# Patient Record
Sex: Male | Born: 1955 | Race: White | Hispanic: No | Marital: Married | Smoking: Former smoker
Health system: Southern US, Community
[De-identification: ages and names within clinical notes are randomized; demographics above are authoritative.]

## PROBLEM LIST (undated history)

## (undated) DIAGNOSIS — E785 Hyperlipidemia, unspecified: Secondary | ICD-10-CM

## (undated) DIAGNOSIS — K3533 Acute appendicitis with perforation and localized peritonitis, with abscess: Secondary | ICD-10-CM

## (undated) DIAGNOSIS — T4145XA Adverse effect of unspecified anesthetic, initial encounter: Secondary | ICD-10-CM

## (undated) DIAGNOSIS — T8859XA Other complications of anesthesia, initial encounter: Secondary | ICD-10-CM

## (undated) HISTORY — DX: Acute appendicitis with perforation, localized peritonitis, and gangrene, with abscess: K35.33

## (undated) HISTORY — DX: Hyperlipidemia, unspecified: E78.5

## (undated) HISTORY — PX: FINGER SURGERY: SHX640

## (undated) HISTORY — PX: HERNIA REPAIR: SHX51

---

## 2013-07-26 ENCOUNTER — Inpatient Hospital Stay (HOSPITAL_COMMUNITY)
Admission: EM | Admit: 2013-07-26 | Discharge: 2013-07-30 | DRG: 339 | Disposition: A | Discharge status: Home / Self Care | Payer: PRIVATE HEALTH INSURANCE | Attending: Surgery | Admitting: Surgery

## 2013-07-26 ENCOUNTER — Encounter (HOSPITAL_COMMUNITY): Payer: Self-pay | Admitting: Emergency Medicine

## 2013-07-26 ENCOUNTER — Encounter (HOSPITAL_COMMUNITY): Admission: EM | Disposition: A | Discharge status: Home / Self Care | Payer: Self-pay | Source: Home / Self Care

## 2013-07-26 ENCOUNTER — Emergency Department (HOSPITAL_COMMUNITY): Payer: PRIVATE HEALTH INSURANCE

## 2013-07-26 DIAGNOSIS — K3532 Acute appendicitis with perforation and localized peritonitis, without abscess: Secondary | ICD-10-CM

## 2013-07-26 DIAGNOSIS — A498 Other bacterial infections of unspecified site: Secondary | ICD-10-CM | POA: Diagnosis present

## 2013-07-26 DIAGNOSIS — K3533 Acute appendicitis with perforation and localized peritonitis, with abscess: Principal | ICD-10-CM | POA: Diagnosis present

## 2013-07-26 DIAGNOSIS — K651 Peritoneal abscess: Secondary | ICD-10-CM | POA: Diagnosis present

## 2013-07-26 DIAGNOSIS — K632 Fistula of intestine: Secondary | ICD-10-CM

## 2013-07-26 DIAGNOSIS — K352 Acute appendicitis with generalized peritonitis, without abscess: Secondary | ICD-10-CM

## 2013-07-26 DIAGNOSIS — K35209 Acute appendicitis with generalized peritonitis, without abscess, unspecified as to perforation: Secondary | ICD-10-CM

## 2013-07-26 DIAGNOSIS — D414 Neoplasm of uncertain behavior of bladder: Secondary | ICD-10-CM | POA: Diagnosis present

## 2013-07-26 HISTORY — DX: Other complications of anesthesia, initial encounter: T88.59XA

## 2013-07-26 HISTORY — DX: Adverse effect of unspecified anesthetic, initial encounter: T41.45XA

## 2013-07-26 LAB — URINALYSIS, ROUTINE W REFLEX MICROSCOPIC
Glucose, UA: NEGATIVE mg/dL
Hgb urine dipstick: NEGATIVE
Ketones, ur: 15 mg/dL — AB
Leukocytes, UA: NEGATIVE
Nitrite: NEGATIVE
Protein, ur: 100 mg/dL — AB
Specific Gravity, Urine: 1.034 — ABNORMAL HIGH (ref 1.005–1.030)
Urobilinogen, UA: 1 mg/dL (ref 0.0–1.0)
pH: 6 (ref 5.0–8.0)

## 2013-07-26 LAB — COMPREHENSIVE METABOLIC PANEL
ALT: 21 U/L (ref 0–53)
AST: 16 U/L (ref 0–37)
Albumin: 3.2 g/dL — ABNORMAL LOW (ref 3.5–5.2)
Alkaline Phosphatase: 125 U/L — ABNORMAL HIGH (ref 39–117)
BUN: 16 mg/dL (ref 6–23)
CO2: 26 mEq/L (ref 19–32)
Calcium: 9.4 mg/dL (ref 8.4–10.5)
Chloride: 100 mEq/L (ref 96–112)
Creatinine, Ser: 1.01 mg/dL (ref 0.50–1.35)
GFR calc Af Amer: >90 mL/min (ref >90)
GFR calc non Af Amer: 81 mL/min — ABNORMAL LOW (ref >90)
Glucose, Bld: 108 mg/dL — ABNORMAL HIGH (ref 70–99)
Potassium: 4.6 mEq/L (ref 3.5–5.1)
Sodium: 137 mEq/L (ref 135–145)
Total Bilirubin: 0.5 mg/dL (ref 0.3–1.2)
Total Protein: 7.6 g/dL (ref 6.0–8.3)

## 2013-07-26 LAB — CBC WITH DIFFERENTIAL/PLATELET
Basophils Absolute: 0 10*3/uL (ref 0.0–0.1)
Basophils Relative: 0 % (ref 0–1)
Eosinophils Absolute: 0 10*3/uL (ref 0.0–0.7)
Eosinophils Relative: 0 % (ref 0–5)
HCT: 38.9 % — ABNORMAL LOW (ref 39.0–52.0)
Hemoglobin: 13.1 g/dL (ref 13.0–17.0)
Lymphocytes Relative: 9 % — ABNORMAL LOW (ref 12–46)
Lymphs Abs: 1.1 10*3/uL (ref 0.7–4.0)
MCH: 29.6 pg (ref 26.0–34.0)
MCHC: 33.7 g/dL (ref 30.0–36.0)
MCV: 87.8 fL (ref 78.0–100.0)
Monocytes Absolute: 1.2 10*3/uL — ABNORMAL HIGH (ref 0.1–1.0)
Monocytes Relative: 10 % (ref 3–12)
Neutro Abs: 10 10*3/uL — ABNORMAL HIGH (ref 1.7–7.7)
Neutrophils Relative %: 81 % — ABNORMAL HIGH (ref 43–77)
Platelets: 302 10*3/uL (ref 150–400)
RBC: 4.43 MIL/uL (ref 4.22–5.81)
RDW: 12.7 % (ref 11.5–15.5)
WBC Morphology: INCREASED
WBC: 12.3 10*3/uL — ABNORMAL HIGH (ref 4.0–10.5)

## 2013-07-26 LAB — URINE MICROSCOPIC-ADD ON

## 2013-07-26 LAB — LIPASE, BLOOD: Lipase: 24 U/L (ref 11–59)

## 2013-07-26 SURGERY — APPENDECTOMY, LAPAROSCOPIC
Anesthesia: General

## 2013-07-26 MED ORDER — MORPHINE SULFATE 4 MG/ML IJ SOLN
4.0000 mg | Freq: Once | INTRAMUSCULAR | Status: AC
  Administered 2013-07-26: 4 mg via INTRAVENOUS
  Filled 2013-07-26: qty 1

## 2013-07-26 MED ORDER — ACETAMINOPHEN 650 MG RE SUPP
650.0000 mg | Freq: Once | RECTAL | Status: AC
  Administered 2013-07-26: 650 mg via RECTAL
  Filled 2013-07-26: qty 1

## 2013-07-26 MED ORDER — MORPHINE SULFATE 2 MG/ML IJ SOLN
1.0000 mg | INTRAMUSCULAR | Status: DC | PRN
  Administered 2013-07-26 – 2013-07-27 (×3): 4 mg via INTRAVENOUS
  Filled 2013-07-26 (×3): qty 2

## 2013-07-26 MED ORDER — IOHEXOL 300 MG/ML  SOLN
50.0000 mL | Freq: Once | INTRAMUSCULAR | Status: AC | PRN
  Administered 2013-07-26: 50 mL via ORAL

## 2013-07-26 MED ORDER — KCL IN DEXTROSE-NACL 20-5-0.45 MEQ/L-%-% IV SOLN
INTRAVENOUS | Status: DC
  Administered 2013-07-26 – 2013-07-27 (×2): 150 mL/h via INTRAVENOUS
  Administered 2013-07-27: 14:00:00 via INTRAVENOUS
  Administered 2013-07-27 – 2013-07-28 (×2): 150 mL/h via INTRAVENOUS
  Administered 2013-07-29 (×2): via INTRAVENOUS
  Filled 2013-07-26 (×12): qty 1000

## 2013-07-26 MED ORDER — ONDANSETRON HCL 4 MG/2ML IJ SOLN
4.0000 mg | Freq: Four times a day (QID) | INTRAMUSCULAR | Status: DC | PRN
  Administered 2013-07-27: 4 mg via INTRAVENOUS
  Filled 2013-07-26: qty 2

## 2013-07-26 MED ORDER — PIPERACILLIN-TAZOBACTAM 3.375 G IVPB
3.3750 g | Freq: Three times a day (TID) | INTRAVENOUS | Status: DC
  Administered 2013-07-26 – 2013-07-30 (×11): 3.375 g via INTRAVENOUS
  Filled 2013-07-26 (×12): qty 50

## 2013-07-26 MED ORDER — SODIUM CHLORIDE 0.9 % IV SOLN
3.0000 g | Freq: Once | INTRAVENOUS | Status: AC
  Administered 2013-07-26: 3 g via INTRAVENOUS
  Filled 2013-07-26: qty 3

## 2013-07-26 MED ORDER — HYDROCODONE-ACETAMINOPHEN 5-325 MG PO TABS
1.0000 | ORAL_TABLET | ORAL | Status: DC | PRN
  Administered 2013-07-27: 1 via ORAL
  Administered 2013-07-27 – 2013-07-30 (×9): 2 via ORAL
  Filled 2013-07-26 (×11): qty 2

## 2013-07-26 MED ORDER — HEPARIN SODIUM (PORCINE) 5000 UNIT/ML IJ SOLN
5000.0000 [IU] | Freq: Three times a day (TID) | INTRAMUSCULAR | Status: DC
  Administered 2013-07-27 – 2013-07-30 (×9): 5000 [IU] via SUBCUTANEOUS
  Filled 2013-07-26 (×12): qty 1

## 2013-07-26 MED ORDER — IOHEXOL 300 MG/ML  SOLN
100.0000 mL | Freq: Once | INTRAMUSCULAR | Status: AC | PRN
  Administered 2013-07-26: 100 mL via INTRAVENOUS

## 2013-07-26 NOTE — ED Notes (Signed)
Password for pt is max

## 2013-07-26 NOTE — Progress Notes (Signed)
Patient confirms he does not have a pcp.  Patient confirms he has insurance. EDCM instructed patient to call the phone number on the back of his insurnace card to help him find a physician who is close to him and within network.  Patient verbalized understanding.  [facility] offered patient support.

## 2013-07-26 NOTE — ED Notes (Signed)
Pt states was sent here by urgent care doctor who said she thinks it is appendicitis from exam.

## 2013-07-26 NOTE — ED Notes (Signed)
Pt states 8 days ago started having fever on and off along w/ abdominal pain/soreness, denies n/v/d, states he thinks he has appendicitis.

## 2013-07-26 NOTE — H&P (Signed)
Re:   Shulem Mader DOB:   1955-10-22 MRN:   [id number]  [facility] Admission Note  ASSESSMENT AND PLAN: 1.  Appendiceal abscess - RLQ  This measures 6 x 3 cm on CT (though it may not be that large).  The patient is 7+ days into his symptoms.  I discussed perc drainage with Dr. Deanne Coffer who thought it possible.  I think that he would be best served with IV antibiotics tonight and perc drainage tomorrow AM.  I discussed with the patient and his wife the possiblity that perc drain may not work and he still may need more urgent surgery.  I also discussed that urgent surgery may be difficult and lead to an increased risk of bowel damage.  I also discussed the need for colonoscopy because of age and presentation.  They seemed to understand the plan.  2.  Polyp in bladder on CT scan.  Will need urology follow up.  I gave the wife a copy of the CT scan.  Chief Complaint  Patient presents with  . Abdominal Pain  . Fever   REFERRING PHYSICIAN:  Dr. Alric Ran, [facility]  HISTORY OF PRESENT ILLNESS: Trashawn Oquendo is a 57 y.o. (DOB: March 10, 1956)  white  male whose primary care physician is No primary provider on file. and comes to [facility] with worsening RLQ pain.  This episode of pain started last Thursday, 07/19/2013, when he developed epigastric abdominal pain.  He has had no prior GI problems and thought that he had food poisoning.  The pain got worse and localized to the RLQ of his abdomen over the weekend.  But then it started getting better from Sunday until yesterday.  Because it got better, he did not seek medical help. Then then pain in the RLQ got worse and today was severe enough that he come to the [facility].  His only prior abdominal surgery is that he has had 2 left inguinal hernia repairs and one right inguinal hernia repairs.  WBC - 07/26/2013 - 12,300    Past Medical History  Diagnosis Date  . Complication of anesthesia     Past Surgical History  Procedure Laterality Date  . Finger surgery       tip repair on finger of left hand  . Hernia repair      x 3    No current facility-administered medications for this encounter.   Current Outpatient Prescriptions  Medication Sig Dispense Refill  . ibuprofen (ADVIL,MOTRIN) 200 MG tablet Take 800 mg by mouth every 6 (six) hours as needed for pain.         No Known Allergies  REVIEW OF SYSTEMS: Skin:  No history of rash.  No history of abnormal moles. Infection:   No history of MRSA. Neurologic:  No history of stroke.  No history of seizure.  No history of headaches. Cardiac:  No history of hypertension. No history of heart disease.  No history of prior cardiac catheterization.  No history of seeing a cardiologist. Pulmonary:  Does not smoke cigarettes.  No asthma or bronchitis.  No OSA/CPAP.  Endocrine:  No diabetes. No thyroid disease. Gastrointestinal:  No history of stomach disease.  No history of liver disease.  No history of gall bladder disease.  No history of pancreas disease.  No history of colon disease. No history of colonoscopy. He has had inguinal hernia repairs - 2 on the left and one on the right. Urologic:  He has some trouble voiding, but is not seeing  a urologist. Musculoskeletal:  He injured the tip of his left ring finger when he was young. Hematologic:  No bleeding disorder.  No history of anemia.  Not anticoagulated. Psycho-social:  The patient is oriented.   He is impatient with all that has gone on.  SOCIAL and FAMILY HISTORY: Wife at bedside. They moved here from [place] about 2 years ago. He is working maintenance for [employer]. His son and daughter in law were in the exam room.  PHYSICAL EXAM: BP 132/71  Pulse 85  Temp(Src) 98.3 F (36.8 C) (Oral)  Resp 18  Ht 6' (1.829 m)  Wt 228 lb (103.42 kg)  BMI 30.92 kg/m2  SpO2 100%  General: WN WM who is alert and generally healthy appearing.  HEENT: Normal. Pupils equal. Neck: Supple. No mass.  No thyroid mass. Lymph Nodes:  No supraclavicular or  cervical nodes. Lungs: Clear to auscultation and symmetric breath sounds. Heart:  RRR. No murmur or rub. Abdomen: Bilateral groin scars.  Tender RLQ with some mass effect. Rectal: Not done. Extremities:  Good strength and ROM  in upper and lower extremities. Neurologic:  Grossly intact to motor and sensory function. Psychiatric: Has normal mood and affect. He is impatient about all that has gone on.  DATA REVIEWED: Epic notes and CT scan.  Ovidio Kin, MD,  [facility] [address] [place]  [address]   [place], [place]    [address] Phone:  [phone] FAX:  [phone]

## 2013-07-26 NOTE — ED Provider Notes (Signed)
CSN: [id number]     Arrival date & time 07/26/13  1352 History   First MD Initiated Contact with Patient 07/26/13 1522     Chief Complaint  Patient presents with  . Abdominal Pain  . Fever   (Consider location/radiation/quality/duration/timing/severity/associated sxs/prior Treatment) Patient is a 57 y.o. male presenting with abdominal pain and fever. The history is provided by the patient.  Abdominal Pain Pain location:  RLQ Pain quality: aching and sharp   Pain radiates to:  Does not radiate Pain severity:  Moderate Onset quality:  Gradual Duration:  7 days Timing:  Intermittent Progression:  Worsening Chronicity:  New Relieved by:  OTC medications and NSAIDs Worsened by:  Coughing and movement Associated symptoms: chills, diarrhea and fever   Associated symptoms: no chest pain, no dysuria, no fatigue, no hematuria, no nausea, no shortness of breath and no vomiting   Associated symptoms comment:  Pt is a 57 yo male in today for progressive 7 day history of abdominal pain. Pain was initially generalized and has localized to the RLQ yesterday. The pain has progressed to a 7/10 over this time period. He has had low grade fevers with a high of 100.77F.  He has been taking 800mg  of ASA q 6-8 hours since onset of symptoms. Patient states that his symptoms are not affected by eating. Patient denies SOB, chest pain, nausea, vomiting, recent illness or travel.   Fever Associated symptoms: chills and diarrhea   Associated symptoms: no chest pain, no congestion, no dysuria, no headaches, no nausea and no vomiting     History reviewed. No pertinent past medical history. Past Surgical History  Procedure Laterality Date  . Hernia repair     No family history on file. History  Substance Use Topics  . Smoking status: Never Smoker   . Smokeless tobacco: Never Used  . Alcohol Use: No    Review of Systems  Constitutional: Positive for fever, chills, diaphoresis, activity change and appetite  change. Negative for fatigue.  HENT: Negative for congestion.   Eyes: Negative for photophobia, pain, discharge, redness, itching and visual disturbance.  Respiratory: Negative for chest tightness and shortness of breath.   Cardiovascular: Negative for chest pain.  Gastrointestinal: Positive for abdominal pain and diarrhea. Negative for nausea, vomiting and blood in stool.  Endocrine: Negative for cold intolerance, heat intolerance, polydipsia, polyphagia and polyuria.  Genitourinary: Negative for dysuria, frequency, hematuria and flank pain.  Allergic/Immunologic: Negative for environmental allergies, food allergies and immunocompromised state.  Neurological: Negative for dizziness, facial asymmetry, weakness, light-headedness and headaches.  Hematological: Negative for adenopathy.    Allergies  Review of patient's allergies indicates no known allergies.  Home Medications   Current Outpatient Rx  Name  Route  Sig  Dispense  Refill  . ibuprofen (ADVIL,MOTRIN) 200 MG tablet   Oral   Take 800 mg by mouth every 6 (six) hours as needed for pain.          BP 132/71  Pulse 85  Temp(Src) 98.3 F (36.8 C) (Oral)  Resp 18  Ht 6' (1.829 m)  Wt 228 lb (103.42 kg)  BMI 30.92 kg/m2  SpO2 100% Physical Exam  Constitutional: He appears well-developed and well-nourished. He is active.  HENT:  Head: Normocephalic.  Eyes: Pupils are equal, round, and reactive to light. Right eye exhibits no discharge. Left eye exhibits no discharge.  Neck: Trachea normal and normal range of motion. Neck supple. No mass and no thyromegaly present.  Cardiovascular: Normal rate, regular rhythm,  S1 normal, S2 normal and normal heart sounds.  Exam reveals no gallop and no friction rub.   No murmur heard. Pulmonary/Chest: Effort normal and breath sounds normal. No respiratory distress. He has no wheezes. He has no rales. He exhibits no tenderness.  Abdominal: Soft. Bowel sounds are normal. He exhibits no  distension and no mass. There is tenderness. There is tenderness at McBurney's point. There is no rigidity, no rebound and no guarding.    Patient has tenderness to palpation of RLQ  Lymphadenopathy:    He has no cervical adenopathy.  Skin: Skin is warm, dry and intact.    ED Course  Procedures (including critical care time) Labs Review Labs Reviewed  CBC WITH DIFFERENTIAL - Abnormal; Notable for the following:    WBC 12.3 (*)    HCT 38.9 (*)    Neutrophils Relative % 81 (*)    Lymphocytes Relative 9 (*)    Neutro Abs 10.0 (*)    Monocytes Absolute 1.2 (*)    All other components within normal limits  COMPREHENSIVE METABOLIC PANEL - Abnormal; Notable for the following:    Glucose, Bld 108 (*)    Albumin 3.2 (*)    Alkaline Phosphatase 125 (*)    GFR calc non Af Amer 81 (*)    All other components within normal limits  LIPASE, BLOOD  URINALYSIS, ROUTINE W REFLEX MICROSCOPIC   Results for orders placed during the hospital encounter of 07/26/13  CBC WITH DIFFERENTIAL      Result Value Range   WBC 12.3 (*) 4.0 - 10.5 K/uL   RBC 4.43  4.22 - 5.81 MIL/uL   Hemoglobin 13.1  13.0 - 17.0 g/dL   HCT 16.1 (*) 09.6 - 04.5 %   MCV 87.8  78.0 - 100.0 fL   MCH 29.6  26.0 - 34.0 pg   MCHC 33.7  30.0 - 36.0 g/dL   RDW 40.9  81.1 - 91.4 %   Platelets 302  150 - 400 K/uL   Neutrophils Relative % 81 (*) 43 - 77 %   Lymphocytes Relative 9 (*) 12 - 46 %   Monocytes Relative 10  3 - 12 %   Eosinophils Relative 0  0 - 5 %   Basophils Relative 0  0 - 1 %   Neutro Abs 10.0 (*) 1.7 - 7.7 K/uL   Lymphs Abs 1.1  0.7 - 4.0 K/uL   Monocytes Absolute 1.2 (*) 0.1 - 1.0 K/uL   Eosinophils Absolute 0.0  0.0 - 0.7 K/uL   Basophils Absolute 0.0  0.0 - 0.1 K/uL   WBC Morphology INCREASED BANDS (>20% BANDS)     Smear Review PLATELET COUNT CONFIRMED BY SMEAR    COMPREHENSIVE METABOLIC PANEL      Result Value Range   Sodium 137  135 - 145 mEq/L   Potassium 4.6  3.5 - 5.1 mEq/L   Chloride 100  96 -  112 mEq/L   CO2 26  19 - 32 mEq/L   Glucose, Bld 108 (*) 70 - 99 mg/dL   BUN 16  6 - 23 mg/dL   Creatinine, Ser 7.82  0.50 - 1.35 mg/dL   Calcium 9.4  8.4 - 95.6 mg/dL   Total Protein 7.6  6.0 - 8.3 g/dL   Albumin 3.2 (*) 3.5 - 5.2 g/dL   AST 16  0 - 37 U/L   ALT 21  0 - 53 U/L   Alkaline Phosphatase 125 (*) 39 - 117 U/L  Total Bilirubin 0.5  0.3 - 1.2 mg/dL   GFR calc non Af Amer 81 (*) >90 mL/min   GFR calc Af Amer >90  >90 mL/min  LIPASE, BLOOD      Result Value Range   Lipase 24  11 - 59 U/L  URINALYSIS, ROUTINE W REFLEX MICROSCOPIC      Result Value Range   Color, Urine AMBER (*) YELLOW   APPearance CLEAR  CLEAR   Specific Gravity, Urine 1.034 (*) 1.005 - 1.030   pH 6.0  5.0 - 8.0   Glucose, UA NEGATIVE  NEGATIVE mg/dL   Hgb urine dipstick NEGATIVE  NEGATIVE   Bilirubin Urine SMALL (*) NEGATIVE   Ketones, ur 15 (*) NEGATIVE mg/dL   Protein, ur 295 (*) NEGATIVE mg/dL   Urobilinogen, UA 1.0  0.0 - 1.0 mg/dL   Nitrite NEGATIVE  NEGATIVE   Leukocytes, UA NEGATIVE  NEGATIVE  URINE MICROSCOPIC-ADD ON      Result Value Range   Bacteria, UA FEW (*) RARE   Ct Abdomen Pelvis W Contrast  07/26/2013   CLINICAL DATA:  Right lower quadrant pain for 8 days  EXAM: CT ABDOMEN AND PELVIS WITH CONTRAST  TECHNIQUE: Multidetector CT imaging of the abdomen and pelvis was performed using the standard protocol following bolus administration of intravenous contrast.  CONTRAST:  OMNIPAQUE IOHEXOL 300 MG/ML  SOLN  COMPARISON:  None.  FINDINGS: Lung bases are unremarkable.  Sagittal images of the spine shows mild degenerative changes lumbar spine.  Enhanced liver is unremarkable. The pancreas, spleen and adrenal glands are unremarkable. Enhanced kidneys are symmetrical in size. No hydronephrosis or hydroureter. Mild right perinephric fluid and stranding as seen in axial image 47.  Delayed renal images shows bilateral renal symmetrical excretion.  There is inflammatory process with irregular  collection with peripheral enhancement in right lower quadrant just medial to the cecum anterior to right psoas muscle. The collection measures at least 6.1 x 3.6 cm. A focal appendicolith is noted within lateral aspect of collection measures about 9 mm. Findings are highly suspicious for perforated appendicitis with appendiceal abscess.  The terminal ileum is unremarkable. No small bowel obstruction. Scattered lymph nodes are noted in right lower quadrant mesentery the largest measures 1.4 cm probable reactive.  There is thickening of urinary bladder wall. Findings may be due to cystitis or chronic inflammation. There is a polypoid filling defect posterior aspect of the urinary bladder midline measures about 1.4 cm. This is best visualized in sagittal image 69. A bladder mass cannot be excluded. Correlation with urology exam and cystoscopy is recommended. No inguinal adenopathy. There is small left inguinal hernia containing fat without evidence of acute complication.  IMPRESSION: 1. There is inflammatory process with irregular collection with peripheral enhancement in right lower quadrant just medial to the cecum anterior to right psoas muscle. The collection measures at least 6.1 x 3.6 cm. A focal appendicolith is noted within lateral aspect of collection measures about 9 mm. Findings are highly suspicious for perforated appendicitis with appendiceal abscess. 2. There is thickening of urinary bladder wall. Findings may be due to cystitis or chronic inflammation. There is a polypoid filling defect posterior aspect of the urinary bladder midline measures about 1.4 cm. This is best visualized in sagittal image 69. A bladder mass cannot be excluded. Correlation with urology exam and cystoscopy is recommended. No inguinal adenopathy. 3. No hydronephrosis or hydroureter. Small amount of right perinephric fluid. Bilateral renal symmetrical excretion.  Critical Value/emergent results were called  by telephone at the time  of interpretation on 07/26/2013 at 5:21 PM to Dr.Addylynn Balin , who verbally acknowledged these results.   Electronically Signed   By: Natasha Mead M.D.   On: 07/26/2013 17:22   Imaging Review No results found.  EKG Interpretation   None       MDM  No diagnosis found. 1. Ruptured appendix with evidence of abscess  Per radiology, results of CT scan phoned and reported as ruptured appendicitis with abscess. Surgery paged. Discussed with Dr. Ovidio Kin who will see patient in the department and admit to surgery.    Arnoldo Hooker, PA-C 07/26/13 1743  Arnoldo Hooker, PA-C 07/28/13 [phone]

## 2013-07-27 ENCOUNTER — Inpatient Hospital Stay (HOSPITAL_COMMUNITY): Payer: PRIVATE HEALTH INSURANCE

## 2013-07-27 LAB — CBC WITH DIFFERENTIAL/PLATELET
Basophils Absolute: 0 10*3/uL (ref 0.0–0.1)
Basophils Relative: 0 % (ref 0–1)
Eosinophils Absolute: 0 10*3/uL (ref 0.0–0.7)
Eosinophils Relative: 0 % (ref 0–5)
HCT: 35.7 % — ABNORMAL LOW (ref 39.0–52.0)
Hemoglobin: 12 g/dL — ABNORMAL LOW (ref 13.0–17.0)
Lymphocytes Relative: 10 % — ABNORMAL LOW (ref 12–46)
Lymphs Abs: 1.1 10*3/uL (ref 0.7–4.0)
MCH: 29.5 pg (ref 26.0–34.0)
MCHC: 33.6 g/dL (ref 30.0–36.0)
MCV: 87.7 fL (ref 78.0–100.0)
Monocytes Absolute: 1.1 10*3/uL — ABNORMAL HIGH (ref 0.1–1.0)
Monocytes Relative: 10 % (ref 3–12)
Neutro Abs: 8.5 10*3/uL — ABNORMAL HIGH (ref 1.7–7.7)
Neutrophils Relative %: 80 % — ABNORMAL HIGH (ref 43–77)
Platelets: 275 10*3/uL (ref 150–400)
RBC: 4.07 MIL/uL — ABNORMAL LOW (ref 4.22–5.81)
RDW: 12.8 % (ref 11.5–15.5)
WBC: 10.7 10*3/uL — ABNORMAL HIGH (ref 4.0–10.5)

## 2013-07-27 LAB — PROTIME-INR
INR: 1.1 (ref 0.00–1.49)
Prothrombin Time: 14 seconds (ref 11.6–15.2)

## 2013-07-27 MED ORDER — FENTANYL CITRATE 0.05 MG/ML IJ SOLN
INTRAMUSCULAR | Status: AC
  Filled 2013-07-27: qty 6

## 2013-07-27 MED ORDER — MIDAZOLAM HCL 2 MG/2ML IJ SOLN
INTRAMUSCULAR | Status: AC | PRN
  Administered 2013-07-27 (×2): 1 mg via INTRAVENOUS

## 2013-07-27 MED ORDER — MIDAZOLAM HCL 5 MG/5ML IJ SOLN
INTRAMUSCULAR | Status: AC | PRN
  Administered 2013-07-27: 1 mg via INTRAVENOUS

## 2013-07-27 MED ORDER — FENTANYL CITRATE 0.05 MG/ML IJ SOLN
INTRAMUSCULAR | Status: AC | PRN
  Administered 2013-07-27: 100 ug via INTRAVENOUS
  Administered 2013-07-27: 50 ug via INTRAVENOUS

## 2013-07-27 MED ORDER — MIDAZOLAM HCL 2 MG/2ML IJ SOLN
INTRAMUSCULAR | Status: AC
  Filled 2013-07-27: qty 6

## 2013-07-27 NOTE — Procedures (Signed)
Successful RLQ ABSCESS DRAIN NO COMP STABLE 28CC PUS ASPIRATED FULL REPORT IN PACS

## 2013-07-27 NOTE — Progress Notes (Signed)
Patient ID: Bobby Bell, male   DOB: 10/15/1955, 57 y.o.   MRN: [id number] Request received for CT guided drainage of an appendiceal abscess in pt with fever, leukocytosis, RLQ pain and findings c/w appendiceal abscess on recent CT. Imaging studies were reviewed by Dr. Miles Costain. Exam: pt awake/alert; chest- CTA bilat predom with occ exp wheeze; heart- RRR; abd- soft, +BS, mildly dist., mod tender RLQ; ext- FROM, no edema.   Filed Vitals:   07/26/13 2310 07/27/13 0131 07/27/13 0536 07/27/13 0825  BP: 111/63 104/60 98/51 114/62  Pulse: 85 86 73   Temp: 99.3 F (37.4 C) 98.3 F (36.8 C) 100 F (37.8 C) 98.2 F (36.8 C)  TempSrc: Oral Oral Oral Oral  Resp: 18 16 16 16   Height:      Weight:      SpO2: 94% 98% 92% 94%   Past Medical History  Diagnosis Date  . Complication of anesthesia    Past Surgical History  Procedure Laterality Date  . Finger surgery      tip repair on finger of left hand  . Hernia repair      x 3   Ct Abdomen Pelvis W Contrast  07/26/2013   CLINICAL DATA:  Right lower quadrant pain for 8 days  EXAM: CT ABDOMEN AND PELVIS WITH CONTRAST  TECHNIQUE: Multidetector CT imaging of the abdomen and pelvis was performed using the standard protocol following bolus administration of intravenous contrast.  CONTRAST:  OMNIPAQUE IOHEXOL 300 MG/ML  SOLN  COMPARISON:  None.  FINDINGS: Lung bases are unremarkable.  Sagittal images of the spine shows mild degenerative changes lumbar spine.  Enhanced liver is unremarkable. The pancreas, spleen and adrenal glands are unremarkable. Enhanced kidneys are symmetrical in size. No hydronephrosis or hydroureter. Mild right perinephric fluid and stranding as seen in axial image 47.  Delayed renal images shows bilateral renal symmetrical excretion.  There is inflammatory process with irregular collection with peripheral enhancement in right lower quadrant just medial to the cecum anterior to right psoas muscle. The collection measures at least 6.1  x 3.6 cm. A focal appendicolith is noted within lateral aspect of collection measures about 9 mm. Findings are highly suspicious for perforated appendicitis with appendiceal abscess.  The terminal ileum is unremarkable. No small bowel obstruction. Scattered lymph nodes are noted in right lower quadrant mesentery the largest measures 1.4 cm probable reactive.  There is thickening of urinary bladder wall. Findings may be due to cystitis or chronic inflammation. There is a polypoid filling defect posterior aspect of the urinary bladder midline measures about 1.4 cm. This is best visualized in sagittal image 69. A bladder mass cannot be excluded. Correlation with urology exam and cystoscopy is recommended. No inguinal adenopathy. There is small left inguinal hernia containing fat without evidence of acute complication.  IMPRESSION: 1. There is inflammatory process with irregular collection with peripheral enhancement in right lower quadrant just medial to the cecum anterior to right psoas muscle. The collection measures at least 6.1 x 3.6 cm. A focal appendicolith is noted within lateral aspect of collection measures about 9 mm. Findings are highly suspicious for perforated appendicitis with appendiceal abscess. 2. There is thickening of urinary bladder wall. Findings may be due to cystitis or chronic inflammation. There is a polypoid filling defect posterior aspect of the urinary bladder midline measures about 1.4 cm. This is best visualized in sagittal image 69. A bladder mass cannot be excluded. Correlation with urology exam and cystoscopy is recommended. No inguinal adenopathy.  3. No hydronephrosis or hydroureter. Small amount of right perinephric fluid. Bilateral renal symmetrical excretion.  Critical Value/emergent results were called by telephone at the time of interpretation on 07/26/2013 at 5:21 PM to Dr.SHARI UPSTILL , who verbally acknowledged these results.   Electronically Signed   By: Natasha Mead M.D.   On:  07/26/2013 17:22  Results for orders placed during the hospital encounter of 07/26/13  CBC WITH DIFFERENTIAL      Result Value Range   WBC 12.3 (*) 4.0 - 10.5 K/uL   RBC 4.43  4.22 - 5.81 MIL/uL   Hemoglobin 13.1  13.0 - 17.0 g/dL   HCT 16.1 (*) 09.6 - 04.5 %   MCV 87.8  78.0 - 100.0 fL   MCH 29.6  26.0 - 34.0 pg   MCHC 33.7  30.0 - 36.0 g/dL   RDW 40.9  81.1 - 91.4 %   Platelets 302  150 - 400 K/uL   Neutrophils Relative % 81 (*) 43 - 77 %   Lymphocytes Relative 9 (*) 12 - 46 %   Monocytes Relative 10  3 - 12 %   Eosinophils Relative 0  0 - 5 %   Basophils Relative 0  0 - 1 %   Neutro Abs 10.0 (*) 1.7 - 7.7 K/uL   Lymphs Abs 1.1  0.7 - 4.0 K/uL   Monocytes Absolute 1.2 (*) 0.1 - 1.0 K/uL   Eosinophils Absolute 0.0  0.0 - 0.7 K/uL   Basophils Absolute 0.0  0.0 - 0.1 K/uL   WBC Morphology INCREASED BANDS (>20% BANDS)     Smear Review PLATELET COUNT CONFIRMED BY SMEAR    COMPREHENSIVE METABOLIC PANEL      Result Value Range   Sodium 137  135 - 145 mEq/L   Potassium 4.6  3.5 - 5.1 mEq/L   Chloride 100  96 - 112 mEq/L   CO2 26  19 - 32 mEq/L   Glucose, Bld 108 (*) 70 - 99 mg/dL   BUN 16  6 - 23 mg/dL   Creatinine, Ser 7.82  0.50 - 1.35 mg/dL   Calcium 9.4  8.4 - 95.6 mg/dL   Total Protein 7.6  6.0 - 8.3 g/dL   Albumin 3.2 (*) 3.5 - 5.2 g/dL   AST 16  0 - 37 U/L   ALT 21  0 - 53 U/L   Alkaline Phosphatase 125 (*) 39 - 117 U/L   Total Bilirubin 0.5  0.3 - 1.2 mg/dL   GFR calc non Af Amer 81 (*) >90 mL/min   GFR calc Af Amer >90  >90 mL/min  LIPASE, BLOOD      Result Value Range   Lipase 24  11 - 59 U/L  URINALYSIS, ROUTINE W REFLEX MICROSCOPIC      Result Value Range   Color, Urine AMBER (*) YELLOW   APPearance CLEAR  CLEAR   Specific Gravity, Urine 1.034 (*) 1.005 - 1.030   pH 6.0  5.0 - 8.0   Glucose, UA NEGATIVE  NEGATIVE mg/dL   Hgb urine dipstick NEGATIVE  NEGATIVE   Bilirubin Urine SMALL (*) NEGATIVE   Ketones, ur 15 (*) NEGATIVE mg/dL   Protein, ur 213 (*)  NEGATIVE mg/dL   Urobilinogen, UA 1.0  0.0 - 1.0 mg/dL   Nitrite NEGATIVE  NEGATIVE   Leukocytes, UA NEGATIVE  NEGATIVE  URINE MICROSCOPIC-ADD ON      Result Value Range   Bacteria, UA FEW (*) RARE  CBC WITH DIFFERENTIAL  Result Value Range   WBC 10.7 (*) 4.0 - 10.5 K/uL   RBC 4.07 (*) 4.22 - 5.81 MIL/uL   Hemoglobin 12.0 (*) 13.0 - 17.0 g/dL   HCT 09.8 (*) 11.9 - 14.7 %   MCV 87.7  78.0 - 100.0 fL   MCH 29.5  26.0 - 34.0 pg   MCHC 33.6  30.0 - 36.0 g/dL   RDW 82.9  56.2 - 13.0 %   Platelets 275  150 - 400 K/uL   Neutrophils Relative % 80 (*) 43 - 77 %   Lymphocytes Relative 10 (*) 12 - 46 %   Monocytes Relative 10  3 - 12 %   Eosinophils Relative 0  0 - 5 %   Basophils Relative 0  0 - 1 %   Neutro Abs 8.5 (*) 1.7 - 7.7 K/uL   Lymphs Abs 1.1  0.7 - 4.0 K/uL   Monocytes Absolute 1.1 (*) 0.1 - 1.0 K/uL   Eosinophils Absolute 0.0  0.0 - 0.7 K/uL   Basophils Absolute 0.0  0.0 - 0.1 K/uL   WBC Morphology VACUOLATED NEUTROPHILS     A/P: Pt with RLQ abd pain, leukocytosis, fever and findings c/w appendiceal abscess on recent CT scan. Plan is for CT guided drainage of appendiceal abscess today. Details/risks of procedure d/w pt/wife with their understanding and consent.

## 2013-07-27 NOTE — Care Management Note (Signed)
    Page 1 of 1   07/30/2013     11:27:49 AM   CARE MANAGEMENT NOTE 07/30/2013  Patient:  MAKIH, STEFANKO   Account Number:  [id number]  Date Initiated:  07/27/2013  Documentation initiated by:  Lorenda Ishihara  Subjective/Objective Assessment:   57 yo male admitted with abd pain, perforated appendix. PTA lived at home with spouse     Action/Plan:   Home when stable   Anticipated DC Date:  07/30/2013   Anticipated DC Plan:  HOME W HOME HEALTH SERVICES      DC Planning Services  CM consult      Choice offered to / List presented to:             Status of service:  Completed, signed off Medicare Important Message given?   (If response is "NO", the following Medicare IM given date fields will be blank) Date Medicare IM given:   Date Additional Medicare IM given:    Discharge Disposition:  HOME/SELF CARE  Per UR Regulation:  Reviewed for med. necessity/level of care/duration of stay  If discussed at Long Length of Stay Meetings, dates discussed:    Comments:  07-30-13 Lorenda Ishihara RN CM 1100 Spoke with patient and spouse at bedside. They feel they will be able to manage drain care themselves. RN to instruct on care. Plans to d/c to son's house, originally from [facility]. but plans to stay here for post op care. Notified bedside nurse of need for instruction.

## 2013-07-27 NOTE — Progress Notes (Signed)
Patient ID: Bobby Bell, male   DOB: 07-05-1956, 57 y.o.   MRN: [id number] 1 Day Post-Op  Subjective: Pt feels better actually since getting his drain placed.  20-30cc of purulent material.  Pain well controlled.  Objective: Vital signs in last 24 hours: Temp:  [98.2 F (36.8 C)-103 F (39.4 C)] 99.3 F (37.4 C) (10/31 1117) Pulse Rate:  [69-92] 75 (10/31 1117) Resp:  [14-21] 14 (10/31 1117) BP: (98-132)/(51-71) 121/71 mmHg (10/31 1117) SpO2:  [92 %-100 %] 94 % (10/31 1102) Weight:  [228 lb (103.42 kg)] 228 lb (103.42 kg) (10/30 1409) Last BM Date: 07/26/13  Intake/Output from previous day: 10/30 0701 - 10/31 0700 In: -  Out: 275 [Urine:275] Intake/Output this shift: Total I/O In: -  Out: 100 [Urine:100]  PE: Abd: soft, tender some in RLQ, +BS, ND, Drain with just a little bloody drainage currently Heart: regular Lungs: CTAB  Lab Results:   Recent Labs  07/26/13 1441 07/27/13 0425  WBC 12.3* 10.7*  HGB 13.1 12.0*  HCT 38.9* 35.7*  PLT 302 275   BMET  Recent Labs  07/26/13 1441  NA 137  K 4.6  CL 100  CO2 26  GLUCOSE 108*  BUN 16  CREATININE 1.01  CALCIUM 9.4   PT/INR  Recent Labs  07/27/13 1015  LABPROT 14.0  INR 1.10   CMP     Component Value Date/Time   NA 137 07/26/2013 1441   K 4.6 07/26/2013 1441   CL 100 07/26/2013 1441   CO2 26 07/26/2013 1441   GLUCOSE 108* 07/26/2013 1441   BUN 16 07/26/2013 1441   CREATININE 1.01 07/26/2013 1441   CALCIUM 9.4 07/26/2013 1441   PROT 7.6 07/26/2013 1441   ALBUMIN 3.2* 07/26/2013 1441   AST 16 07/26/2013 1441   ALT 21 07/26/2013 1441   ALKPHOS 125* 07/26/2013 1441   BILITOT 0.5 07/26/2013 1441   GFRNONAA 81* 07/26/2013 1441   GFRAA >90 07/26/2013 1441   Lipase     Component Value Date/Time   LIPASE 24 07/26/2013 1441       Studies/Results: Ct Abdomen Pelvis W Contrast  07/26/2013   CLINICAL DATA:  Right lower quadrant pain for 8 days  EXAM: CT ABDOMEN AND PELVIS WITH CONTRAST   TECHNIQUE: Multidetector CT imaging of the abdomen and pelvis was performed using the standard protocol following bolus administration of intravenous contrast.  CONTRAST:  OMNIPAQUE IOHEXOL 300 MG/ML  SOLN  COMPARISON:  None.  FINDINGS: Lung bases are unremarkable.  Sagittal images of the spine shows mild degenerative changes lumbar spine.  Enhanced liver is unremarkable. The pancreas, spleen and adrenal glands are unremarkable. Enhanced kidneys are symmetrical in size. No hydronephrosis or hydroureter. Mild right perinephric fluid and stranding as seen in axial image 47.  Delayed renal images shows bilateral renal symmetrical excretion.  There is inflammatory process with irregular collection with peripheral enhancement in right lower quadrant just medial to the cecum anterior to right psoas muscle. The collection measures at least 6.1 x 3.6 cm. A focal appendicolith is noted within lateral aspect of collection measures about 9 mm. Findings are highly suspicious for perforated appendicitis with appendiceal abscess.  The terminal ileum is unremarkable. No small bowel obstruction. Scattered lymph nodes are noted in right lower quadrant mesentery the largest measures 1.4 cm probable reactive.  There is thickening of urinary bladder wall. Findings may be due to cystitis or chronic inflammation. There is a polypoid filling defect posterior aspect of the urinary bladder midline  measures about 1.4 cm. This is best visualized in sagittal image 69. A bladder mass cannot be excluded. Correlation with urology exam and cystoscopy is recommended. No inguinal adenopathy. There is small left inguinal hernia containing fat without evidence of acute complication.  IMPRESSION: 1. There is inflammatory process with irregular collection with peripheral enhancement in right lower quadrant just medial to the cecum anterior to right psoas muscle. The collection measures at least 6.1 x 3.6 cm. A focal appendicolith is noted within  lateral aspect of collection measures about 9 mm. Findings are highly suspicious for perforated appendicitis with appendiceal abscess. 2. There is thickening of urinary bladder wall. Findings may be due to cystitis or chronic inflammation. There is a polypoid filling defect posterior aspect of the urinary bladder midline measures about 1.4 cm. This is best visualized in sagittal image 69. A bladder mass cannot be excluded. Correlation with urology exam and cystoscopy is recommended. No inguinal adenopathy. 3. No hydronephrosis or hydroureter. Small amount of right perinephric fluid. Bilateral renal symmetrical excretion.  Critical Value/emergent results were called by telephone at the time of interpretation on 07/26/2013 at 5:21 PM to Dr.SHARI UPSTILL , who verbally acknowledged these results.   Electronically Signed   By: Natasha Mead M.D.   On: 07/26/2013 17:22    Anti-infectives: Anti-infectives   Start     Dose/Rate Route Frequency Ordered Stop   07/27/13 0000  piperacillin-tazobactam (ZOSYN) IVPB 3.375 g     3.375 g 12.5 mL/hr over 240 Minutes Intravenous 3 times per day 07/26/13 2309     07/26/13 1800  Ampicillin-Sulbactam (UNASYN) 3 g in sodium chloride 0.9 % 100 mL IVPB     3 g 100 mL/hr over 60 Minutes Intravenous  Once 07/26/13 1736 07/26/13 1914       Assessment/Plan  1. Acute perforated appendicitis with abscess and appendicolith  2. Bladder wall thickening  Plan: 1. Cont IV abx therapy 2. Cont JP drain 3. Clear liquids 4. Will try to avoid surgical intervention 5. Will need follow up outpatient with [place], GI likely for c-scope, and with urology for bladder thickening and polyp.  LOS: 1 day    OSBORNE,KELLY E 07/27/2013, 12:29 PM Pager: [phone] Feels better after drain but still bloated.  Hopefully he will continue to improve with abx

## 2013-07-28 LAB — CBC
HCT: 32 % — ABNORMAL LOW (ref 39.0–52.0)
Hemoglobin: 10.6 g/dL — ABNORMAL LOW (ref 13.0–17.0)
MCH: 29 pg (ref 26.0–34.0)
MCHC: 33.1 g/dL (ref 30.0–36.0)
MCV: 87.4 fL (ref 78.0–100.0)
Platelets: 256 10*3/uL (ref 150–400)
RBC: 3.66 MIL/uL — ABNORMAL LOW (ref 4.22–5.81)
RDW: 12.8 % (ref 11.5–15.5)
WBC: 7.3 10*3/uL (ref 4.0–10.5)

## 2013-07-28 LAB — BASIC METABOLIC PANEL
BUN: 11 mg/dL (ref 6–23)
CO2: 24 mEq/L (ref 19–32)
Calcium: 8.6 mg/dL (ref 8.4–10.5)
Chloride: 100 mEq/L (ref 96–112)
Creatinine, Ser: 1.1 mg/dL (ref 0.50–1.35)
GFR calc Af Amer: 85 mL/min — ABNORMAL LOW (ref >90)
GFR calc non Af Amer: 73 mL/min — ABNORMAL LOW (ref >90)
Glucose, Bld: 133 mg/dL — ABNORMAL HIGH (ref 70–99)
Potassium: 3.8 mEq/L (ref 3.5–5.1)
Sodium: 133 mEq/L — ABNORMAL LOW (ref 135–145)

## 2013-07-28 NOTE — Progress Notes (Signed)
2 Days Post-Op  Subjective: Doing ok. Still feels swollen. +flatus. Denies n/v. Tolerated liquids. Some pain in RLQ but overall feels a lot better since drain placed.   Objective: Vital signs in last 24 hours: Temp:  [97.9 F (36.6 C)-99.7 F (37.6 C)] 97.9 F (36.6 C) (11/01 1000) Pulse Rate:  [60-83] 60 (11/01 1000) Resp:  [14-21] 16 (11/01 1000) BP: (104-131)/(56-76) 110/76 mmHg (11/01 1000) SpO2:  [93 %-99 %] 94 % (11/01 1000) Last BM Date: 07/26/13  Intake/Output from previous day: 10/31 0701 - 11/01 0700 In: -  Out: 1030 [Urine:1000; Drains:30] Intake/Output this shift: Total I/O In: 360 [P.O.:360] Out: -   Alert, nad cta b/l Reg Soft, mild distension. +bs; mild RLQ TTP. No rt/guarding Drain - brown  Lab Results:   Recent Labs  07/27/13 0425 07/28/13 0537  WBC 10.7* 7.3  HGB 12.0* 10.6*  HCT 35.7* 32.0*  PLT 275 256   BMET  Recent Labs  07/26/13 1441 07/28/13 0537  NA 137 133*  K 4.6 3.8  CL 100 100  CO2 26 24  GLUCOSE 108* 133*  BUN 16 11  CREATININE 1.01 1.10  CALCIUM 9.4 8.6   PT/INR  Recent Labs  07/27/13 1015  LABPROT 14.0  INR 1.10   ABG No results found for this basename: PHART, PCO2, PO2, HCO3,  in the last 72 hours  Studies/Results: Ct Abdomen Pelvis W Contrast  07/26/2013   CLINICAL DATA:  Right lower quadrant pain for 8 days  EXAM: CT ABDOMEN AND PELVIS WITH CONTRAST  TECHNIQUE: Multidetector CT imaging of the abdomen and pelvis was performed using the standard protocol following bolus administration of intravenous contrast.  CONTRAST:  OMNIPAQUE IOHEXOL 300 MG/ML  SOLN  COMPARISON:  None.  FINDINGS: Lung bases are unremarkable.  Sagittal images of the spine shows mild degenerative changes lumbar spine.  Enhanced liver is unremarkable. The pancreas, spleen and adrenal glands are unremarkable. Enhanced kidneys are symmetrical in size. No hydronephrosis or hydroureter. Mild right perinephric fluid and stranding as seen in  axial image 47.  Delayed renal images shows bilateral renal symmetrical excretion.  There is inflammatory process with irregular collection with peripheral enhancement in right lower quadrant just medial to the cecum anterior to right psoas muscle. The collection measures at least 6.1 x 3.6 cm. A focal appendicolith is noted within lateral aspect of collection measures about 9 mm. Findings are highly suspicious for perforated appendicitis with appendiceal abscess.  The terminal ileum is unremarkable. No small bowel obstruction. Scattered lymph nodes are noted in right lower quadrant mesentery the largest measures 1.4 cm probable reactive.  There is thickening of urinary bladder wall. Findings may be due to cystitis or chronic inflammation. There is a polypoid filling defect posterior aspect of the urinary bladder midline measures about 1.4 cm. This is best visualized in sagittal image 69. A bladder mass cannot be excluded. Correlation with urology exam and cystoscopy is recommended. No inguinal adenopathy. There is small left inguinal hernia containing fat without evidence of acute complication.  IMPRESSION: 1. There is inflammatory process with irregular collection with peripheral enhancement in right lower quadrant just medial to the cecum anterior to right psoas muscle. The collection measures at least 6.1 x 3.6 cm. A focal appendicolith is noted within lateral aspect of collection measures about 9 mm. Findings are highly suspicious for perforated appendicitis with appendiceal abscess. 2. There is thickening of urinary bladder wall. Findings may be due to cystitis or chronic inflammation. There is a  polypoid filling defect posterior aspect of the urinary bladder midline measures about 1.4 cm. This is best visualized in sagittal image 69. A bladder mass cannot be excluded. Correlation with urology exam and cystoscopy is recommended. No inguinal adenopathy. 3. No hydronephrosis or hydroureter. Small amount of right  perinephric fluid. Bilateral renal symmetrical excretion.  Critical Value/emergent results were called by telephone at the time of interpretation on 07/26/2013 at 5:21 PM to Dr.SHARI UPSTILL , who verbally acknowledged these results.   Electronically Signed   By: Natasha Mead M.D.   On: 07/26/2013 17:22   Ct Image Guided Fluid Drain By Catheter  07/27/2013   CLINICAL DATA:  Ruptured appendicitis with a right lower quadrant abscess  EXAM: CT-guided right lower quadrant appendiceal abscess drain insertion  Date:  10/31/201410/31/2014 11:22 AM  Radiologist:  Judie Petit. Ruel Favors, MD  Guidance:  CT  MEDICATIONS AND MEDICAL HISTORY: 3 mg Versed, 150 mcg fentanyl  ANESTHESIA/SEDATION: 15 min  CONTRAST:  None.  FLUOROSCOPY TIME:  None.  PROCEDURE: Informed consent was obtained from the patient following explanation of the procedure, risks, benefits and alternatives. The patient understands, agrees and consents for the procedure. All questions were addressed. A time out was performed.  Maximal barrier sterile technique utilized including caps, mask, sterile gowns, sterile gloves, large sterile drape, hand hygiene, and Betadine.  Previous imaging reviewed. Patient positioned supine. Noncontrast localization CT performed. Right lower quadrant appendiceal abscess was localized. Under sterile conditions and local anesthesia, and8 gauge 10 cm access needle was advanced from a lateral approach into the fluid collection. Needle position confirmed with CT. Syringe aspiration yielded exudative fluid. Sample sent for Gram stain and culture. Guidewire inserted followed by tract dilatation to insert a 10 [place] drain. The retention loop was formed in the abscess cavity. 28 cc exudative fluid aspirated. The catheter was secured with prolene suture and connected to external suction bulb. Sterile dressing applied. No immediate complication. Patient tolerated the procedure well.  COMPLICATIONS: No immediate  IMPRESSION: Successful CT-guided  right lower quadrant appendiceal abscess drain insertion   Electronically Signed   By: Ruel Favors M.D.   On: 07/27/2013 13:10    Anti-infectives: Anti-infectives   Start     Dose/Rate Route Frequency Ordered Stop   07/27/13 0000  piperacillin-tazobactam (ZOSYN) IVPB 3.375 g     3.375 g 12.5 mL/hr over 240 Minutes Intravenous 3 times per day 07/26/13 2309     07/26/13 1800  Ampicillin-Sulbactam (UNASYN) 3 g in sodium chloride 0.9 % 100 mL IVPB     3 g 100 mL/hr over 60 Minutes Intravenous  Once 07/26/13 1736 07/26/13 1914      Assessment/Plan: s/p Procedure(s) with comments: Surgery Canceled (N/A) - Surgery Canceled 1)Ruptured appendicitis with abscess -cont clears today -cont IV abx - cx so far are E coli; f/u susceptibilities -outpt colonscopy per dr Ezzard Standing once acute process resolved -no fever, tachy, wbc down so will cont with drain mgmt. Explained that sometimes abx and drain not sufficient  2)F/E/N - decrease IVF 3)Anemia - hgb down. Could be dilutional. Repeat cbc in am.  4) bladder polyp - outpt urology f/u.   Mary Sella. Andrey Campanile, MD, FACS General, Bariatric, & Minimally Invasive Surgery [facility], [place]    LOS: 2 days    Atilano Ina 07/28/2013

## 2013-07-28 NOTE — Progress Notes (Signed)
Subjective: Pt feeling ok, better than yesterday. Some soreness at drain site, but 'not that bad'   Objective: Physical Exam: BP 110/76  Pulse 60  Temp(Src) 97.9 F (36.6 C) (Oral)  Resp 16  Ht 6' (1.829 m)  Wt 228 lb (103.42 kg)  BMI 30.92 kg/m2  SpO2 94% RLQ drain intact, site clean, mild tenderness at insertion site. Thin brown output, 30cc recorded yesterday, at least another 15-20cc in bulb now.    Labs: CBC  Recent Labs  07/27/13 0425 07/28/13 0537  WBC 10.7* 7.3  HGB 12.0* 10.6*  HCT 35.7* 32.0*  PLT 275 256   BMET  Recent Labs  07/26/13 1441 07/28/13 0537  NA 137 133*  K 4.6 3.8  CL 100 100  CO2 26 24  GLUCOSE 108* 133*  BUN 16 11  CREATININE 1.01 1.10  CALCIUM 9.4 8.6   LFT  Recent Labs  07/26/13 1441  PROT 7.6  ALBUMIN 3.2*  AST 16  ALT 21  ALKPHOS 125*  BILITOT 0.5  LIPASE 24   PT/INR  Recent Labs  07/27/13 1015  LABPROT 14.0  INR 1.10     Studies/Results: Ct Abdomen Pelvis W Contrast  07/26/2013   CLINICAL DATA:  Right lower quadrant pain for 8 days  EXAM: CT ABDOMEN AND PELVIS WITH CONTRAST  TECHNIQUE: Multidetector CT imaging of the abdomen and pelvis was performed using the standard protocol following bolus administration of intravenous contrast.  CONTRAST:  OMNIPAQUE IOHEXOL 300 MG/ML  SOLN  COMPARISON:  None.  FINDINGS: Lung bases are unremarkable.  Sagittal images of the spine shows mild degenerative changes lumbar spine.  Enhanced liver is unremarkable. The pancreas, spleen and adrenal glands are unremarkable. Enhanced kidneys are symmetrical in size. No hydronephrosis or hydroureter. Mild right perinephric fluid and stranding as seen in axial image 47.  Delayed renal images shows bilateral renal symmetrical excretion.  There is inflammatory process with irregular collection with peripheral enhancement in right lower quadrant just medial to the cecum anterior to right psoas muscle. The collection measures at least 6.1  x 3.6 cm. A focal appendicolith is noted within lateral aspect of collection measures about 9 mm. Findings are highly suspicious for perforated appendicitis with appendiceal abscess.  The terminal ileum is unremarkable. No small bowel obstruction. Scattered lymph nodes are noted in right lower quadrant mesentery the largest measures 1.4 cm probable reactive.  There is thickening of urinary bladder wall. Findings may be due to cystitis or chronic inflammation. There is a polypoid filling defect posterior aspect of the urinary bladder midline measures about 1.4 cm. This is best visualized in sagittal image 69. A bladder mass cannot be excluded. Correlation with urology exam and cystoscopy is recommended. No inguinal adenopathy. There is small left inguinal hernia containing fat without evidence of acute complication.  IMPRESSION: 1. There is inflammatory process with irregular collection with peripheral enhancement in right lower quadrant just medial to the cecum anterior to right psoas muscle. The collection measures at least 6.1 x 3.6 cm. A focal appendicolith is noted within lateral aspect of collection measures about 9 mm. Findings are highly suspicious for perforated appendicitis with appendiceal abscess. 2. There is thickening of urinary bladder wall. Findings may be due to cystitis or chronic inflammation. There is a polypoid filling defect posterior aspect of the urinary bladder midline measures about 1.4 cm. This is best visualized in sagittal image 69. A bladder mass cannot be excluded. Correlation with urology exam and cystoscopy is recommended. No inguinal  adenopathy. 3. No hydronephrosis or hydroureter. Small amount of right perinephric fluid. Bilateral renal symmetrical excretion.  Critical Value/emergent results were called by telephone at the time of interpretation on 07/26/2013 at 5:21 PM to Dr.SHARI UPSTILL , who verbally acknowledged these results.   Electronically Signed   By: Natasha Mead M.D.   On:  07/26/2013 17:22   Ct Image Guided Fluid Drain By Catheter  07/27/2013   CLINICAL DATA:  Ruptured appendicitis with a right lower quadrant abscess  EXAM: CT-guided right lower quadrant appendiceal abscess drain insertion  Date:  10/31/201410/31/2014 11:22 AM  Radiologist:  Judie Petit. Ruel Favors, MD  Guidance:  CT  MEDICATIONS AND MEDICAL HISTORY: 3 mg Versed, 150 mcg fentanyl  ANESTHESIA/SEDATION: 15 min  CONTRAST:  None.  FLUOROSCOPY TIME:  None.  PROCEDURE: Informed consent was obtained from the patient following explanation of the procedure, risks, benefits and alternatives. The patient understands, agrees and consents for the procedure. All questions were addressed. A time out was performed.  Maximal barrier sterile technique utilized including caps, mask, sterile gowns, sterile gloves, large sterile drape, hand hygiene, and Betadine.  Previous imaging reviewed. Patient positioned supine. Noncontrast localization CT performed. Right lower quadrant appendiceal abscess was localized. Under sterile conditions and local anesthesia, and8 gauge 10 cm access needle was advanced from a lateral approach into the fluid collection. Needle position confirmed with CT. Syringe aspiration yielded exudative fluid. Sample sent for Gram stain and culture. Guidewire inserted followed by tract dilatation to insert a 10 [place] drain. The retention loop was formed in the abscess cavity. 28 cc exudative fluid aspirated. The catheter was secured with prolene suture and connected to external suction bulb. Sterile dressing applied. No immediate complication. Patient tolerated the procedure well.  COMPLICATIONS: No immediate  IMPRESSION: Successful CT-guided right lower quadrant appendiceal abscess drain insertion   Electronically Signed   By: Ruel Favors M.D.   On: 07/27/2013 13:10    Assessment/Plan: S/p RLQ perc drain for rupt appendiceal abscess WBC down, afebrile Plans per [facility]    LOS: 2 days    Brayton El  PA-C 07/28/2013 10:28 AM

## 2013-07-28 NOTE — ED Provider Notes (Signed)
Medical screening examination/treatment/procedure(s) were conducted as a shared visit with non-physician practitioner(s) and myself.  I personally evaluated the patient during the encounter.  EKG Interpretation     Ventricular Rate:    PR Interval:    QRS Duration:   QT Interval:    QTC Calculation:   R Axis:     Text Interpretation:              Patient with ruptured appy. Given abx and pain control. Soft abd, no peritonitis. Dr. Ezzard Standing to admit.  Audree Camel, MD 07/28/13 [phone]

## 2013-07-29 DIAGNOSIS — D414 Neoplasm of uncertain behavior of bladder: Secondary | ICD-10-CM | POA: Diagnosis present

## 2013-07-29 DIAGNOSIS — K3532 Acute appendicitis with perforation and localized peritonitis, without abscess: Secondary | ICD-10-CM | POA: Diagnosis present

## 2013-07-29 DIAGNOSIS — K651 Peritoneal abscess: Secondary | ICD-10-CM | POA: Diagnosis present

## 2013-07-29 LAB — CULTURE, ROUTINE-ABSCESS: Special Requests: NORMAL

## 2013-07-29 LAB — BASIC METABOLIC PANEL
BUN: 8 mg/dL (ref 6–23)
CO2: 27 mEq/L (ref 19–32)
Calcium: 9.2 mg/dL (ref 8.4–10.5)
Chloride: 103 mEq/L (ref 96–112)
Creatinine, Ser: 1.09 mg/dL (ref 0.50–1.35)
GFR calc Af Amer: 86 mL/min — ABNORMAL LOW (ref >90)
GFR calc non Af Amer: 74 mL/min — ABNORMAL LOW (ref >90)
Glucose, Bld: 100 mg/dL — ABNORMAL HIGH (ref 70–99)
Potassium: 4.1 mEq/L (ref 3.5–5.1)
Sodium: 138 mEq/L (ref 135–145)

## 2013-07-29 LAB — CBC
HCT: 36.5 % — ABNORMAL LOW (ref 39.0–52.0)
Hemoglobin: 12.1 g/dL — ABNORMAL LOW (ref 13.0–17.0)
MCH: 29.2 pg (ref 26.0–34.0)
MCHC: 33.2 g/dL (ref 30.0–36.0)
MCV: 88.2 fL (ref 78.0–100.0)
Platelets: 318 10*3/uL (ref 150–400)
RBC: 4.14 MIL/uL — ABNORMAL LOW (ref 4.22–5.81)
RDW: 12.7 % (ref 11.5–15.5)
WBC: 5.9 10*3/uL (ref 4.0–10.5)

## 2013-07-29 LAB — MAGNESIUM: Magnesium: 2.5 mg/dL (ref 1.5–2.5)

## 2013-07-29 NOTE — Progress Notes (Signed)
Subjective: Doing well. Tolerated liquids. No n/v/worsening pain. Doesn't have abd pain. +flatus. Wants to know when he is going to be able to go home  Objective: Vital signs in last 24 hours: Temp:  [97.8 F (36.6 C)-98.5 F (36.9 C)] 97.8 F (36.6 C) (11/02 0600) Pulse Rate:  [55-91] 55 (11/02 0600) Resp:  [16] 16 (11/02 0600) BP: (107-119)/(67-78) 119/78 mmHg (11/02 0600) SpO2:  [94 %-98 %] 97 % (11/02 0600) Last BM Date: 07/26/13  Intake/Output from previous day: 11/01 0701 - 11/02 0700 In: 1621 [P.O.:840; I.V.:375; IV Piggyback:400] Out: 2725 [Urine:2675; Drains:50] Intake/Output this shift:    Asleep, easily awakens cta  Reg Soft, nt, nd. Drain - brown liquid  Lab Results:   Recent Labs  07/28/13 0537 07/29/13 0535  WBC 7.3 5.9  HGB 10.6* 12.1*  HCT 32.0* 36.5*  PLT 256 318   BMET  Recent Labs  07/28/13 0537 07/29/13 0535  NA 133* 138  K 3.8 4.1  CL 100 103  CO2 24 27  GLUCOSE 133* 100*  BUN 11 8  CREATININE 1.10 1.09  CALCIUM 8.6 9.2   PT/INR  Recent Labs  07/27/13 1015  LABPROT 14.0  INR 1.10   ABG No results found for this basename: PHART, PCO2, PO2, HCO3,  in the last 72 hours  Studies/Results: Ct Image Guided Fluid Drain By Catheter  07/27/2013   CLINICAL DATA:  Ruptured appendicitis with a right lower quadrant abscess  EXAM: CT-guided right lower quadrant appendiceal abscess drain insertion  Date:  10/31/201410/31/2014 11:22 AM  Radiologist:  Judie Petit. Ruel Favors, MD  Guidance:  CT  MEDICATIONS AND MEDICAL HISTORY: 3 mg Versed, 150 mcg fentanyl  ANESTHESIA/SEDATION: 15 min  CONTRAST:  None.  FLUOROSCOPY TIME:  None.  PROCEDURE: Informed consent was obtained from the patient following explanation of the procedure, risks, benefits and alternatives. The patient understands, agrees and consents for the procedure. All questions were addressed. A time out was performed.  Maximal barrier sterile technique utilized including caps, mask, sterile gowns,  sterile gloves, large sterile drape, hand hygiene, and Betadine.  Previous imaging reviewed. Patient positioned supine. Noncontrast localization CT performed. Right lower quadrant appendiceal abscess was localized. Under sterile conditions and local anesthesia, and8 gauge 10 cm access needle was advanced from a lateral approach into the fluid collection. Needle position confirmed with CT. Syringe aspiration yielded exudative fluid. Sample sent for Gram stain and culture. Guidewire inserted followed by tract dilatation to insert a 10 [place] drain. The retention loop was formed in the abscess cavity. 28 cc exudative fluid aspirated. The catheter was secured with prolene suture and connected to external suction bulb. Sterile dressing applied. No immediate complication. Patient tolerated the procedure well.  COMPLICATIONS: No immediate  IMPRESSION: Successful CT-guided right lower quadrant appendiceal abscess drain insertion   Electronically Signed   By: Ruel Favors M.D.   On: 07/27/2013 13:10    Anti-infectives: Anti-infectives   Start     Dose/Rate Route Frequency Ordered Stop   07/27/13 0000  piperacillin-tazobactam (ZOSYN) IVPB 3.375 g     3.375 g 12.5 mL/hr over 240 Minutes Intravenous 3 times per day 07/26/13 2309     07/26/13 1800  Ampicillin-Sulbactam (UNASYN) 3 g in sodium chloride 0.9 % 100 mL IVPB     3 g 100 mL/hr over 60 Minutes Intravenous  Once 07/26/13 1736 07/26/13 1914      Assessment/Plan: 1)Ruptured appendicitis with abscess  -adv to full liquids today  -cont IV abx - cx so  far are E coli; f/u susceptibilities  -outpt colonscopy per dr Ezzard Standing once acute process resolved  -no fever, tachy, abd pain, wbc normal so will cont with drain mgmt and abx. 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    Mary Sella. Andrey Campanile, MD, FACS General, Bariatric, & Minimally Invasive Surgery [facility], [place]   LOS: 3 days    Atilano Ina 07/29/2013

## 2013-07-29 NOTE — Progress Notes (Signed)
Subjective: Pt feeling ok, better than yesterday. Diet advancing, wants to go home. Minimal pain   Objective: Physical Exam: BP 119/78  Pulse 55  Temp(Src) 97.8 F (36.6 C) (Oral)  Resp 16  Ht 6' (1.829 m)  Wt 228 lb (103.42 kg)  BMI 30.92 kg/m2  SpO2 97% RLQ drain intact, site clean, mild tenderness at insertion site. Thin brown output, 50cc recorded yesterday,    Labs: CBC  Recent Labs  07/28/13 0537 07/29/13 0535  WBC 7.3 5.9  HGB 10.6* 12.1*  HCT 32.0* 36.5*  PLT 256 318   BMET  Recent Labs  07/28/13 0537 07/29/13 0535  NA 133* 138  K 3.8 4.1  CL 100 103  CO2 24 27  GLUCOSE 133* 100*  BUN 11 8  CREATININE 1.10 1.09  CALCIUM 8.6 9.2   LFT  Recent Labs  07/26/13 1441  PROT 7.6  ALBUMIN 3.2*  AST 16  ALT 21  ALKPHOS 125*  BILITOT 0.5  LIPASE 24   PT/INR  Recent Labs  07/27/13 1015  LABPROT 14.0  INR 1.10     Studies/Results: Ct Image Guided Fluid Drain By Catheter  07/27/2013   CLINICAL DATA:  Ruptured appendicitis with a right lower quadrant abscess  EXAM: CT-guided right lower quadrant appendiceal abscess drain insertion  Date:  10/31/201410/31/2014 11:22 AM  Radiologist:  Judie Petit. Ruel Favors, MD  Guidance:  CT  MEDICATIONS AND MEDICAL HISTORY: 3 mg Versed, 150 mcg fentanyl  ANESTHESIA/SEDATION: 15 min  CONTRAST:  None.  FLUOROSCOPY TIME:  None.  PROCEDURE: Informed consent was obtained from the patient following explanation of the procedure, risks, benefits and alternatives. The patient understands, agrees and consents for the procedure. All questions were addressed. A time out was performed.  Maximal barrier sterile technique utilized including caps, mask, sterile gowns, sterile gloves, large sterile drape, hand hygiene, and Betadine.  Previous imaging reviewed. Patient positioned supine. Noncontrast localization CT performed. Right lower quadrant appendiceal abscess was localized. Under sterile conditions and local anesthesia, and8 gauge 10  cm access needle was advanced from a lateral approach into the fluid collection. Needle position confirmed with CT. Syringe aspiration yielded exudative fluid. Sample sent for Gram stain and culture. Guidewire inserted followed by tract dilatation to insert a 10 [place] drain. The retention loop was formed in the abscess cavity. 28 cc exudative fluid aspirated. The catheter was secured with prolene suture and connected to external suction bulb. Sterile dressing applied. No immediate complication. Patient tolerated the procedure well.  COMPLICATIONS: No immediate  IMPRESSION: Successful CT-guided right lower quadrant appendiceal abscess drain insertion   Electronically Signed   By: Ruel Favors M.D.   On: 07/27/2013 13:10    Assessment/Plan: S/p RLQ perc drain for rupt appendiceal abscess WBC normal, afebrile. Output brown concerning for fistula, but could just be abscess contents, cont to monitor. Plans per [facility]    LOS: 3 days    Brayton El PA-C 07/29/2013 10:36 AM

## 2013-07-30 DIAGNOSIS — K632 Fistula of intestine: Secondary | ICD-10-CM

## 2013-07-30 LAB — CBC
HCT: 34.9 % — ABNORMAL LOW (ref 39.0–52.0)
Hemoglobin: 11.6 g/dL — ABNORMAL LOW (ref 13.0–17.0)
MCH: 29.1 pg (ref 26.0–34.0)
MCHC: 33.2 g/dL (ref 30.0–36.0)
MCV: 87.7 fL (ref 78.0–100.0)
Platelets: 348 10*3/uL (ref 150–400)
RBC: 3.98 MIL/uL — ABNORMAL LOW (ref 4.22–5.81)
RDW: 12.8 % (ref 11.5–15.5)
WBC: 5 10*3/uL (ref 4.0–10.5)

## 2013-07-30 MED ORDER — AMOXICILLIN-POT CLAVULANATE 875-125 MG PO TABS: 1.0000 | ORAL_TABLET | Freq: Two times a day (BID) | ORAL | Status: DC

## 2013-07-30 MED ORDER — HYDROCODONE-ACETAMINOPHEN 5-325 MG PO TABS: 1.0000 | ORAL_TABLET | ORAL | Status: DC | PRN

## 2013-07-30 MED ORDER — AMOXICILLIN-POT CLAVULANATE 875-125 MG PO TABS
1.0000 | ORAL_TABLET | Freq: Two times a day (BID) | ORAL | Status: DC
  Administered 2013-07-30: 1 via ORAL
  Filled 2013-07-30 (×2): qty 1

## 2013-07-30 NOTE — Progress Notes (Signed)
Walking in hallways.   Wife in room. Tol lunch  General: Pt awake/alert/oriented x4 in no major acute distress Eyes: PERRL, normal EOM. Sclera nonicteric Neuro: CN II-XII intact w/o focal sensory/motor deficits. Lymph: No head/neck/groin lymphadenopathy Psych:  No delerium/psychosis/paranoia HENT: Normocephalic, Mucus membranes moist.  No thrush Neck: Supple, No tracheal deviation Chest: No pain.  Good respiratory excursion. CV:  Pulses intact.  Regular rhythm MS: Normal AROM mjr joints.  No obvious deformity Abdomen: Soft, Nondistended.  Nontender.  No incarcerated hernias.  Brown output in drain Ext:  SCDs BLE.  No significant edema.  No cyanosis Skin: No petechiae / purpura   No difficulty with urination.  Tolerating solid food  Drainage suspicious for liquid stool / probable controlled leak.  Okay to send the patient home on PO ABX with drain & close followup.  Suspect he will need at least a drain study to make sure there is no fistula anymore. I will hold off until drainage consistency changes to something more serosanguineous.  May need repeat CT scan.  Anticipate interval appendectomy in 6 weeks.

## 2013-07-30 NOTE — Discharge Summary (Signed)
Physician Discharge Summary  Patient ID: Bobby Bell MRN: [id number] DOB/AGE: 56-Feb-1957 56 y.o.  Admit date: 07/26/2013 Discharge date: 07/30/2013  Admission Diagnoses: Appendiceal abscess - RLQ Polyp in bladder on CT scan.   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 07/27/13, Dr.Shick.  Hospital Course: Bobby Bell is a 57 y.o. (DOB: [birth year]) white male whose primary care physician is No primary provider on file. and comes to [facility] with worsening RLQ pain.  This episode of pain started last Thursday, 07/19/2013, when he developed epigastric abdominal pain. He has had no prior GI problems and thought that he had food poisoning. The pain got worse and localized to the RLQ of his abdomen over the weekend. But then it started getting better from Sunday until yesterday. Because it got better, he did not seek medical help. Then then pain in the RLQ got worse and today was severe enough that he come to the [facility].  His only prior abdominal surgery is that he has had 2 left inguinal hernia repairs and one right inguinal hernia repairs. CT scan shows a 6 x 3 cm fluid collection, consistent with an abscess, focal appendicolith. He was admitted by Dr. Ezzard Standing and then evaluated and drain place by IR and Dr. Miles Costain.  He was placed on IV antibiotics and transitioned to PO antibiotics on 07/30/13.  He is draining a dark cloudy colored fluid and the drain tubing has some brown colored fluid concerning for stool.  He was seen by Dr. Michaell Cowing who discharged him home for another week of oral antibiotics and follow up with Dr. Ezzard Standing in the office.  Drain care instructions were given and the patient and wife did not feel they needed Home Health care. He will follow up with Dr. Ezzard Standing next week.          Disposition: Final  discharge disposition not confirmed  Discharge Orders   Future Appointments Provider Department Dept Phone   08/08/2013 9:20 AM Kandis Cocking, MD [facility] Surgery, [place] [phone]   Future Orders Complete By Expires   Call MD for:  extreme fatigue  As directed    Call MD for:  hives  As directed    Call MD for:  persistant nausea and vomiting  As directed    Call MD for:  redness, tenderness, or signs of infection (pain, swelling, redness, odor or green/yellow discharge around incision site)  As directed    Call MD for:  severe uncontrolled pain  As directed    Call MD for:  As directed    Comments:     Temperature > 101.50F   Diet - low sodium heart healthy  As directed    Discharge instructions  As directed    Comments:     Please see discharge instruction sheets.  Also refer to handout given an office.  Please call our office if you have any questions or concerns [phone]   Discharge wound care:  As directed    Comments:     If you have closed incisions, shower and bathe over these incisions with soap and water every day.  Remove all surgical dressings on postoperative day #3.  You do not need to replace dressings over the closed incisions unless you feel more comfortable with a Band-Aid covering it.   If you have an open wound that requires  packing, please see wound care instructions.  In general, remove all dressings, wash wound with soap and water and then replace with saline moistened gauze.  Do the dressing change at least every day.  Please call our office [phone] if you have further questions.   Drain care  As directed    Comments:     Teach the patient how to manage drains / tubes if any remaining at the time of discharge.  Teach how to self-d/c if approriate such as Foley catheters or OnQ pump catheters.   Examples:  1. Care of drain(s) placed at the time of surgery or by Interventional radiology - usually removed by surgeon or radiologist later as an  outpatient 2. OnQ pain pump catheter care.  Teach pt/family removal at home when fully collapsed 4 days after placement in OR 3. Foley catheters placed for urinary retention usually removed 3 days after placement by the patient or in [facility]/Urology office ...etc)   Driving Restrictions  As directed    Comments:     No driving until off narcotics and can safely swerve away without pain during an emergency   Increase activity slowly  As directed    Comments:     Walk an hour a day.  Use 20-30 minute walks.  When you can walk 30 minutes without difficulty, increase to low impact/moderate activities such as biking, jogging, swimming, sexual activity..  Eventually can increase to unrestricted activity when not feeling pain.  If you feel pain: STOP!Marland Kitchen   Let pain protect you from overdoing it.  Use ice/heat/over-the-counter pain medications to help minimize his soreness.  Use pain prescriptions as needed to remain active.  It is better to take extra pain medications and be more active than to stay bedridden to avoid all pain medications.   Lifting restrictions  As directed    Comments:     Avoid heavy lifting initially.  Do not push through pain.  You have no specific weight limit.  Coughing and sneezing or four more stressful to your incision than any lifting you will do. Pain will protect you from injury.  Therefore, avoid intense activity until off all narcotic pain medications.  Coughing and sneezing or four more stressful to your incision than any lifting he will do.   May shower / Bathe  As directed    May walk up steps  As directed    Sexual Activity Restrictions  As directed    Comments:     Sexual activity as tolerated.  Do not push through pain.  Pain will protect you from injury.   Walk with assistance  As directed    Comments:     Walk over an hour a day.  May use a walker/cane/companion to help with balance and stamina.       Medication List         amoxicillin-clavulanate 875-125 MG  per tablet  Commonly known as:  AUGMENTIN  Take 1 tablet by mouth every 12 (twelve) hours.     HYDROcodone-acetaminophen 5-325 MG per tablet  Commonly known as:  NORCO/VICODIN  Take 1-2 tablets by mouth every 4 (four) hours as needed.     ibuprofen 200 MG tablet  Commonly known as:  ADVIL,MOTRIN  Take 800 mg by mouth every 6 (six) hours as needed for pain.           Follow-up Information   Follow up with [facility], MD On 08/08/2013. (Be at the office for check in at  9:15 AM)    Specialty:  General Surgery   Contact information:   [address] [place] [address] [phone]       Signed: Sherrie George 07/30/2013, 2:24 PM

## 2013-07-30 NOTE — Discharge Summary (Signed)
Patient looks good, tolerating solid food. Probable colocutaneous fistula at the appendicitis but it is controlled with the drain. PO ABx x 7 days.  Setup for close outpt. Probably will need drain study versus repeat CAT scan in a week or so.

## 2013-07-30 NOTE — Progress Notes (Signed)
4 Days Post-Op  Subjective: He feels good and anxious to go home.  Drainage is dark cloudy colored drainage. No real BM so far.  Objective: Vital signs in last 24 hours: Temp:  [97.5 F (36.4 C)-97.9 F (36.6 C)] 97.5 F (36.4 C) (11/03 0450) Pulse Rate:  [56-66] 56 (11/03 0450) Resp:  [16-18] 16 (11/03 0450) BP: (105-120)/(67-75) 109/75 mmHg (11/03 0450) SpO2:  [95 %-99 %] 97 % (11/03 0450) PO Last BM Date: 07/26/13 840 po 23 ML FROM THE DRAIN 1 BM Diet: full liquids Afebrile, VSS WBC is normal H/H is stable Intake/Output from previous day: 11/02 0701 - 11/03 0700 In: 2817.5 [P.O.:840; I.V.:1827.5; IV Piggyback:150] Out: 2676 [Urine:2652; Drains:23; Stool:1] Intake/Output this shift:    General appearance: alert, cooperative and no distress GI: soft, non-tender; bowel sounds normal; no masses,  no organomegaly and dark colored cloudy drainage in bulb, some brown colored drainage from the drain line..  Lab Results:   Recent Labs  07/29/13 0535 07/30/13 0412  WBC 5.9 5.0  HGB 12.1* 11.6*  HCT 36.5* 34.9*  PLT 318 348    BMET  Recent Labs  07/28/13 0537 07/29/13 0535  NA 133* 138  K 3.8 4.1  CL 100 103  CO2 24 27  GLUCOSE 133* 100*  BUN 11 8  CREATININE 1.10 1.09  CALCIUM 8.6 9.2   PT/INR  Recent Labs  07/27/13 1015  LABPROT 14.0  INR 1.10     Recent Labs Lab 07/26/13 1441  AST 16  ALT 21  ALKPHOS 125*  BILITOT 0.5  PROT 7.6  ALBUMIN 3.2*     Lipase     Component Value Date/Time   LIPASE 24 07/26/2013 1441     Studies/Results: No results found.  Medications: . heparin  5,000 Units Subcutaneous Q8H  . piperacillin-tazobactam (ZOSYN)  IV  3.375 g Intravenous Q8H    Assessment/Plan Ruptured appendix with abscess IR drain placement 07/27/13 (culture grew out E.coli) No growth in anaerobic culture. Day 4 of Zosyn Bladder Polyp Hx of hernia repair   Plan:  I will switch him over to oral antibiotics and if once Dr. Michaell Cowing  see's him decide on D/C.  Plan a week of Augmentin and then follow up with Dr. Ezzard Standing about 10 days. I will ask Nursing staff to teach care of drain and ask Case management to assist also.  LOS: 4 days    Welda Azzarello 07/30/2013

## 2013-08-01 LAB — ANAEROBIC CULTURE: Special Requests: NORMAL

## 2013-08-08 ENCOUNTER — Encounter (INDEPENDENT_AMBULATORY_CARE_PROVIDER_SITE_OTHER): Payer: Self-pay | Admitting: Surgery

## 2013-08-08 ENCOUNTER — Ambulatory Visit (INDEPENDENT_AMBULATORY_CARE_PROVIDER_SITE_OTHER): Payer: PRIVATE HEALTH INSURANCE | Admitting: Surgery

## 2013-08-08 ENCOUNTER — Other Ambulatory Visit (INDEPENDENT_AMBULATORY_CARE_PROVIDER_SITE_OTHER): Payer: Self-pay

## 2013-08-08 VITALS — BP 134/74 | HR 73 | Resp 18 | Ht 70.0 in | Wt 218.0 lb

## 2013-08-08 DIAGNOSIS — K3580 Unspecified acute appendicitis: Secondary | ICD-10-CM

## 2013-08-08 DIAGNOSIS — K632 Fistula of intestine: Secondary | ICD-10-CM

## 2013-08-08 NOTE — Progress Notes (Addendum)
Re:   Bobby Bell DOB:   [birth year] MRN:   [id number]  ASSESSMENT AND PLAN: 1. Appendiceal abscess - RLQ   Perc drain by Dr. Miles Costain on 08/26/2013  On Augmentin.  He looks good. Will plan CT scan to evaluate the abscess and possible fistula.  (I gave him a prescription for saline syringes - if needed).  Will probably need eventual appendectomy - colonoscopy first.  I will call him on the phone after the CT scan. [His CT scan looked good.  His drain was removed.  I need to see him back in about 8 weeks.  Then can plan colonoscopy and possible appendectomy.  I left message on answering machine.  DN  08/10/2013]   2. Polyp in bladder on CT scan.   Will need urology follow up.   I have the wife a copy of the CT scan.  Chief Complaint  Patient presents with  . Routine Post Op    append   REFERRING PHYSICIAN: No primary provider on file.  HISTORY OF PRESENT ILLNESS: Bobby Bell is a 57 y.o. (DOB: 1955-12-24)  white  male whose primary care physician is No primary provider on file. and comes to me today for follow up of an appendiceal abscess. He was hospitalized from 07/26/2013  - 07/30/2013.  He has not been reimaged. His wife is with him.  He is doing well.  He is having minimal pain.  His bowel movements are okay.  He has had minimal drainage, looks like less than 5cc per day.  We talked about a plan - CT scan/fistula eval - before removing the drain.  Past Medical History  Diagnosis Date  . Complication of anesthesia      Past Surgical History  Procedure Laterality Date  . Finger surgery      tip repair on finger of left hand  . Hernia repair      x 3     Current Outpatient Prescriptions  Medication Sig Dispense Refill  . amoxicillin-clavulanate (AUGMENTIN) 875-125 MG per tablet Take 1 tablet by mouth every 12 (twelve) hours.  14 tablet  1  . HYDROcodone-acetaminophen (NORCO/VICODIN) 5-325 MG per tablet Take 1-2 tablets by mouth every 4 (four) hours as needed.  30 tablet  0    . ibuprofen (ADVIL,MOTRIN) 200 MG tablet Take 800 mg by mouth every 6 (six) hours as needed for pain.       No current facility-administered medications for this visit.     No Known Allergies  REVIEW OF SYSTEMS: Skin: No history of rash. No history of abnormal moles.  Infection: No history of MRSA.  Neurologic: No history of stroke. No history of seizure. No history of headaches.  Cardiac: No history of hypertension. No history of heart disease. No history of prior cardiac catheterization. No history of seeing a cardiologist.  Pulmonary: Does not smoke cigarettes. No asthma or bronchitis. No OSA/CPAP.   Endocrine: No diabetes. No thyroid disease.  Gastrointestinal: No history of stomach disease. No history of liver disease. No history of gall bladder disease. No history of pancreas disease. No history of colon disease. No history of colonoscopy. He has had inguinal hernia repairs - 2 on the left and one on the right.  Urologic: He has some trouble voiding, but is not seeing a urologist.  Musculoskeletal: He injured the tip of his left ring finger when he was young.  Hematologic: No bleeding disorder. No history of anemia. Not anticoagulated.  Psycho-social: The patient is oriented.  He is impatient with all that has gone on.  SOCIAL and FAMILY HISTORY: Wife is with him.  They moved here from [place] about 2 years ago.  He did work for [employer].  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  PHYSICAL EXAM: BP 134/74  Pulse 73  Resp 18  Ht 5\' 10"  (1.778 m)  Wt 218 lb (98.884 kg)  BMI 31.28 kg/m2  General: WN WM who is alert and generally healthy appearing.  Lungs: Clear to auscultation and symmetric breath sounds. Heart:  RRR. No murmur or rub. Abdomen: Soft. Somewhat of a mass effect in the RLQ.  The drain comes out just above the ant iliac scan - it has almost no fluid in it. Rectal: Not done.  DATA REVIEWED: Epic notes  Ovidio Kin, MD,   [facility] Surgery, [place] [address] [place]  [address]   [place], [place]    [address] Phone:  [phone] FAX:  [phone]

## 2013-08-10 ENCOUNTER — Ambulatory Visit (HOSPITAL_COMMUNITY)
Admission: RE | Admit: 2013-08-10 | Discharge: 2013-08-10 | Disposition: A | Discharge status: Home / Self Care | Payer: PRIVATE HEALTH INSURANCE | Source: Ambulatory Visit | Attending: Surgery | Admitting: Surgery

## 2013-08-10 ENCOUNTER — Other Ambulatory Visit (INDEPENDENT_AMBULATORY_CARE_PROVIDER_SITE_OTHER): Payer: Self-pay | Admitting: Surgery

## 2013-08-10 DIAGNOSIS — L988 Other specified disorders of the skin and subcutaneous tissue: Secondary | ICD-10-CM

## 2013-08-10 DIAGNOSIS — K37 Unspecified appendicitis: Secondary | ICD-10-CM | POA: Insufficient documentation

## 2013-08-10 MED ORDER — IOHEXOL 300 MG/ML  SOLN
80.0000 mL | Freq: Once | INTRAMUSCULAR | Status: AC | PRN
  Administered 2013-08-10: 80 mL via INTRAVENOUS

## 2013-08-10 MED ORDER — IOHEXOL 300 MG/ML  SOLN
50.0000 mL | Freq: Once | INTRAMUSCULAR | Status: AC | PRN
  Administered 2013-08-10: 10 mL

## 2013-08-13 ENCOUNTER — Telehealth (INDEPENDENT_AMBULATORY_CARE_PROVIDER_SITE_OTHER): Payer: Self-pay

## 2013-08-13 NOTE — Telephone Encounter (Signed)
appt 10-04-2013 @ 9am patient aware

## 2013-10-04 ENCOUNTER — Encounter (INDEPENDENT_AMBULATORY_CARE_PROVIDER_SITE_OTHER): Payer: PRIVATE HEALTH INSURANCE | Admitting: Surgery

## 2014-10-10 ENCOUNTER — Encounter (HOSPITAL_COMMUNITY): Payer: Self-pay | Admitting: Surgery

## 2016-04-04 ENCOUNTER — Encounter (HOSPITAL_COMMUNITY): Payer: Self-pay | Admitting: Emergency Medicine

## 2016-04-04 ENCOUNTER — Emergency Department (HOSPITAL_COMMUNITY)
Admission: EM | Admit: 2016-04-04 | Discharge: 2016-04-04 | Disposition: A | Discharge status: Home / Self Care | Payer: BLUE CROSS/BLUE SHIELD | Attending: Emergency Medicine | Admitting: Emergency Medicine

## 2016-04-04 DIAGNOSIS — H5712 Ocular pain, left eye: Secondary | ICD-10-CM | POA: Diagnosis present

## 2016-04-04 DIAGNOSIS — H109 Unspecified conjunctivitis: Secondary | ICD-10-CM | POA: Diagnosis not present

## 2016-04-04 DIAGNOSIS — Z87891 Personal history of nicotine dependence: Secondary | ICD-10-CM | POA: Diagnosis not present

## 2016-04-04 MED ORDER — TETRACAINE HCL 0.5 % OP SOLN
2.0000 [drp] | Freq: Once | OPHTHALMIC | Status: DC
  Filled 2016-04-04: qty 4

## 2016-04-04 MED ORDER — HYDROCODONE-ACETAMINOPHEN 5-325 MG PO TABS: 1.0000 | ORAL_TABLET | ORAL | Status: DC | PRN

## 2016-04-04 MED ORDER — FLUORESCEIN SODIUM 1 MG OP STRP
1.0000 | ORAL_STRIP | Freq: Once | OPHTHALMIC | Status: DC
  Filled 2016-04-04: qty 1

## 2016-04-04 MED ORDER — TOBRAMYCIN 0.3 % OP SOLN
2.0000 [drp] | Freq: Once | OPHTHALMIC | Status: AC
  Administered 2016-04-04: 2 [drp] via OPHTHALMIC
  Filled 2016-04-04: qty 5

## 2016-04-04 NOTE — Discharge Instructions (Signed)
Your exam is consistent with conjunctivitis/pink eye. Please do not use your contacts for the next 7 days. Wash hands frequently. Use 2 drops of tobramycin every 4 hours for the next 5 days. See your eye specialist if not improving.Use tylenol or ibuprofen for mild pain and headache. Use norco for more severe pain or headache. This medication may cause drowsiness. Please do not drink, drive, or participate in activity that requires concentration while taking this medication. Bacterial Conjunctivitis Bacterial conjunctivitis (commonly called pink eye) is redness, soreness, or puffiness (inflammation) of the white part of your eye. It is caused by a germ called bacteria. These germs can easily spread from person to person (contagious). Your eye often will become red or pink. Your eye may also become irritated, watery, or have a thick discharge.  HOME CARE   Apply a cool, clean washcloth over closed eyelids. Do this for 10-20 minutes, 3-4 times a day while you have pain.  Gently wipe away any fluid coming from the eye with a warm, wet washcloth or cotton ball.  Wash your hands often with soap and water. Use paper towels to dry your hands.  Do not share towels or washcloths.  Change or wash your pillowcase every day.  Do not use eye makeup until the infection is gone.  Do not use machines or drive if your vision is blurry.  Stop using contact lenses. Do not use them again until your doctor says it is okay.  Do not touch the tip of the eye drop bottle or medicine tube with your fingers when you put medicine on the eye. GET HELP RIGHT AWAY IF:   Your eye is not better after 3 days of starting your medicine.  You have a yellowish fluid coming out of the eye.  You have more pain in the eye.  Your eye redness is spreading.  Your vision becomes blurry.  You have a fever or lasting symptoms for more than 2-3 days.  You have a fever and your symptoms suddenly get worse.  You have pain in  the face.  Your face gets red or puffy (swollen). MAKE SURE YOU:   Understand these instructions.  Will watch this condition.  Will get help right away if you are not doing well or get worse.   This information is not intended to replace advice given to you by your health care provider. Make sure you discuss any questions you have with your health care provider.   Document Released: 06/22/2008 Document Revised: 08/30/2012 Document Reviewed: 05/19/2012 Elsevier Interactive Patient Education [employer].

## 2016-04-04 NOTE — ED Provider Notes (Signed)
CSN: [id number]     Arrival date & time 04/04/16  1733 History  By signing my name below, I, Eustaquio Maize, attest that this documentation has been prepared under the direction and in the presence of Lily Kocher, PA-C.  Electronically Signed: Eustaquio Maize, ED Scribe. 04/04/2016. 7:15 PM.   Chief Complaint  Patient presents with  . Eye Pain   Patient is a 60 y.o. male presenting with eye pain. The history is provided by the patient. No language interpreter was used.  Eye Pain This is a new problem. The current episode started yesterday. The problem occurs rarely. The problem has not changed since onset.He has tried nothing for the symptoms. The treatment provided no relief.   HPI Comments: Bobby Bell is a 60 y.o. male who presents to the Emergency Department complaining of gradual onset, constant, mild, left eye pain that began yesterday afternoon. Pt is a contact lens wearer. He reports being on a boat yesterday when he began having irritation to the eye despite wearing sunglasses. No known injury to the eye. Later on in the day he removed the contact lens and notes worsening pain afterwards. He mentions that after removing the contact lens he had a foreign body sensation in his eye and admits to rubbing his eye in an attempt to move the possible foreign body. He also complains of watery discharge and photophobia that began this morning. Denies blurry vision, double vision, loss of vision, or any other associated symptoms.   Past Medical History  Diagnosis Date  . Complication of anesthesia    Past Surgical History  Procedure Laterality Date  . Finger surgery      tip repair on finger of left hand  . Hernia repair      x 3   History reviewed. No pertinent family history. Social History  Substance Use Topics  . Smoking status: Former Smoker    Types: Cigarettes    Quit date: 07/27/2011  . Smokeless tobacco: Never Used  . Alcohol Use: No    Review of Systems  Constitutional:  Negative for fever.  Eyes: Positive for photophobia, pain, discharge and redness. Negative for visual disturbance.  All other systems reviewed and are negative.  Allergies  Review of patient's allergies indicates no known allergies.  Home Medications   Prior to Admission medications   Medication Sig Start Date End Date Taking? Authorizing Provider  amoxicillin-clavulanate (AUGMENTIN) 875-125 MG per tablet Take 1 tablet by mouth every 12 (twelve) hours. 07/30/13   Michael Boston, MD  HYDROcodone-acetaminophen (NORCO/VICODIN) 5-325 MG per tablet Take 1-2 tablets by mouth every 4 (four) hours as needed. 07/30/13   Michael Boston, MD  ibuprofen (ADVIL,MOTRIN) 200 MG tablet Take 800 mg by mouth every 6 (six) hours as needed for pain.    Historical Provider, MD   BP 149/77 mmHg  Pulse 68  Temp(Src) 97.8 F (36.6 C) (Oral)  Resp 17  Ht 6' (1.829 m)  Wt 215 lb (97.523 kg)  BMI 29.15 kg/m2  SpO2 100%   Physical Exam  Constitutional: He is oriented to person, place, and time. He appears well-developed and well-nourished. No distress.  HENT:  Head: Normocephalic and atraumatic.  Eyes: EOM are normal. Left eye exhibits no hordeolum. Left conjunctiva is injected.  Fundoscopic exam:      The right eye shows no AV nicking, no exudate, no hemorrhage and no papilledema.       The left eye shows no AV nicking, no exudate, no hemorrhage and no  papilledema.  Slit lamp exam:      The left eye shows no corneal abrasion, no corneal ulcer, no foreign body, no hyphema and no fluorescein uptake.  There is no swelling of the lids on the left or right. No evidence of foreign body under the left lid. The conjunctiva is injected on left. The anterior chamber is clear bilaterally. There is increased redness and mild swelling of the vulvar conjunctiva.  EOMs are intact.   Neck: Neck supple. No tracheal deviation present.  Cardiovascular: Normal rate.   Pulmonary/Chest: Effort normal. No respiratory distress.   Musculoskeletal: Normal range of motion.  Neurological: He is alert and oriented to person, place, and time.  Skin: Skin is warm and dry.  Psychiatric: He has a normal mood and affect. His behavior is normal.  Nursing note and vitals reviewed.   ED Course  Procedures (including critical care time)  DIAGNOSTIC STUDIES: Oxygen Saturation is 100% on RA, normal by my interpretation.    COORDINATION OF CARE: 7:08 PM-Discussed treatment plan which includes fluorescein strip test with pt at bedside and pt agreed to plan.   Labs Review Labs Reviewed - No data to display  Imaging Review No results found. I have personally reviewed and evaluated these images and lab results as part of my medical decision-making.   EKG Interpretation None      MDM Exam favors conjunctivitis. No abrasion or ulcer noted on exam. Pt will use dark glasses, hat with a brim, and tobramycin. We discussed cool compresses. Pt to follow up with his eye specialist is not improving.   Final diagnoses:  Conjunctivitis of left eye    **I personally performed the services described in this documentation, which was scribed in my presence. The recorded information has been reviewed and is accurate. I have reviewed nursing notes, vital signs, and all appropriate lab and imaging results for this patient.     Lily Kocher, PA-C 04/04/16 [place], MD 04/08/16 [phone]

## 2016-04-04 NOTE — ED Notes (Signed)
Patient c/o left eye pain. Per patient wears contacts, was fishing yesterday when eye started to hurt, unable to remove contact for approx 3 hours but when he was able remove it-patient states "pain seemed to be worse." Per patient clear drainage. Patient also reports that pain is not as bad as it was yesterday. Patient's sclera red.

## 2016-12-24 ENCOUNTER — Ambulatory Visit (INDEPENDENT_AMBULATORY_CARE_PROVIDER_SITE_OTHER): Payer: Self-pay

## 2016-12-24 ENCOUNTER — Other Ambulatory Visit: Payer: Self-pay | Admitting: Emergency Medicine

## 2016-12-24 DIAGNOSIS — Z0000 Encounter for general adult medical examination without abnormal findings: Secondary | ICD-10-CM

## 2016-12-24 DIAGNOSIS — R911 Solitary pulmonary nodule: Secondary | ICD-10-CM

## 2016-12-24 DIAGNOSIS — Z0001 Encounter for general adult medical examination with abnormal findings: Secondary | ICD-10-CM

## 2017-01-10 ENCOUNTER — Ambulatory Visit: Payer: Self-pay | Admitting: Family Medicine

## 2017-06-02 ENCOUNTER — Encounter: Payer: Self-pay | Admitting: Family Medicine

## 2017-06-02 ENCOUNTER — Ambulatory Visit (INDEPENDENT_AMBULATORY_CARE_PROVIDER_SITE_OTHER): Payer: BLUE CROSS/BLUE SHIELD | Admitting: Family Medicine

## 2017-06-02 VITALS — BP 130/72 | HR 88 | Temp 98.5°F | Resp 14 | Ht 70.0 in | Wt 218.0 lb

## 2017-06-02 DIAGNOSIS — Z7689 Persons encountering health services in other specified circumstances: Secondary | ICD-10-CM | POA: Diagnosis not present

## 2017-06-02 DIAGNOSIS — Z125 Encounter for screening for malignant neoplasm of prostate: Secondary | ICD-10-CM | POA: Diagnosis not present

## 2017-06-02 DIAGNOSIS — R911 Solitary pulmonary nodule: Secondary | ICD-10-CM | POA: Diagnosis not present

## 2017-06-02 DIAGNOSIS — Z0000 Encounter for general adult medical examination without abnormal findings: Secondary | ICD-10-CM | POA: Diagnosis not present

## 2017-06-02 NOTE — Progress Notes (Signed)
Subjective:    Patient ID: Bobby Bell, male    DOB: 1956/04/08, 61 y.o.   MRN: [id number]  HPI Here for CPE and to establish care.  The patient moved to [place] possibly 5 years ago from [place]. 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 Recently he had a preemployment physical exam performed. Part of the physical exam included a chest x-ray which revealed a 11 mm nodular density in the right mid lung.  Patient is asymptomatic. He denies any hemoptysis. He denies any cough or chest pain. He denies any pleurisy. He does have a remote history of smoking. He states that he smoked approximately 1 pack a day for 15 years but he quit more than 5 years ago. He has never had a colonoscopy and he refuses a colonoscopy. We did discuss cologuard but he declined. He does have a strong family history of prostate cancer in his father and his 2 brothers and therefore he would like a PSA that he declines a digital rectal exam. In addition to providing his chest x-ray results, he also comes with blood work which revealed a CBC that was completely normal. He also had a CMP was completely normal. Urinalysis was completely normal. Fasting lipid panel was significant for an LDL cholesterol of 121, and HDL cholesterol of 54, triglycerides of 175, and a total cholesterol of 210. He had pulmonary function tests that were normal. An EKG shows normal sinus rhythm with normal intervals and normal axis with no evidence of ischemia or infarction. All this will be scanned into his chart. Past Medical History:  Diagnosis Date  . Complication of anesthesia   . Hyperlipidemia    Past Surgical History:  Procedure Laterality Date  . FINGER SURGERY     tip repair on finger of left hand  . HERNIA REPAIR     x 3   No current outpatient prescriptions on file prior to visit.   No current facility-administered medications on file prior to visit.    No Known Allergies Social History   Social History  .  Marital status: Married    Spouse name: N/A  . Number of children: N/A  . Years of education: N/A   Occupational History  . Not on file.   Social History Main Topics  . Smoking status: Former Smoker    Types: Cigarettes    Quit date: 07/27/2011  . Smokeless tobacco: Never Used  . Alcohol use No  . Drug use: No     Comment: smoked age 7 to 57 yrs,-1ppd, smoked off and on in 2012  . Sexual activity: Yes   Other Topics Concern  . Not on file   Social History Narrative  . No narrative on file   Family History  Problem Relation Age of Onset  . Hypertension Mother   . Hyperlipidemia Mother   . Heart disease Mother   . Cancer Father        prostate- 8  . Cancer Brother        prostate cancer x2 brothers      Review of Systems  All other systems reviewed and are negative.      Objective:   Physical Exam  Constitutional: He is oriented to person, place, and time. He appears well-developed and well-nourished. No distress.  HENT:  Head: Normocephalic and atraumatic.  Right Ear: External ear normal.  Left Ear: External ear normal.  Nose: Nose normal.  Mouth/Throat: Oropharynx is clear and  moist. No oropharyngeal exudate.  Eyes: Pupils are equal, round, and reactive to light. Conjunctivae and EOM are normal. Right eye exhibits no discharge. Left eye exhibits no discharge. No scleral icterus.  Neck: Normal range of motion. Neck supple. No JVD present. No tracheal deviation present. No thyromegaly present.  Cardiovascular: Normal rate, regular rhythm, normal heart sounds and intact distal pulses.  Exam reveals no gallop and no friction rub.   No murmur heard. Pulmonary/Chest: Effort normal and breath sounds normal. No stridor. No respiratory distress. He has no wheezes. He has no rales. He exhibits no tenderness.  Abdominal: Soft. Bowel sounds are normal. He exhibits no distension and no mass. There is no tenderness. There is no rebound and no guarding.  Musculoskeletal:  Normal range of motion. He exhibits no edema, tenderness or deformity.  Lymphadenopathy:    He has no cervical adenopathy.  Neurological: He is alert and oriented to person, place, and time. He has normal reflexes. He displays normal reflexes. No cranial nerve deficit. He exhibits normal muscle tone. Coordination normal.  Skin: Skin is warm. No rash noted. He is not diaphoretic. No erythema. No pallor.  Psychiatric: He has a normal mood and affect. His behavior is normal. Judgment and thought content normal.  Vitals reviewed.         Assessment & Plan:  Prostate cancer screening - Plan: PSA  Encounter to establish care with new doctor  Pulmonary nodule  General medical exam  Solitary pulmonary nodule - Plan: CT Chest W Contrast  I calculated his 10 year risk of coronary artery disease and found to be approximately 8% based on his most recent lab work. Patient adamantly declines statin therapy. He refuses a flu shot. He declines a colonoscopy or cologuard.  He does consent to a PSA to screen for prostate cancer. He would also like me to schedule him for a CT scan of his chest to evaluate the pulmonary nodule further. I will gladly schedule this for him.

## 2017-06-03 LAB — PSA: PSA: 1.8 ng/mL (ref <=4.0)

## 2017-06-08 ENCOUNTER — Other Ambulatory Visit: Payer: BLUE CROSS/BLUE SHIELD

## 2017-06-14 ENCOUNTER — Ambulatory Visit
Admission: RE | Admit: 2017-06-14 | Discharge: 2017-06-14 | Disposition: A | Discharge status: Home / Self Care | Payer: BLUE CROSS/BLUE SHIELD | Source: Ambulatory Visit | Attending: Family Medicine | Admitting: Family Medicine

## 2017-06-14 DIAGNOSIS — R911 Solitary pulmonary nodule: Secondary | ICD-10-CM

## 2017-06-14 MED ORDER — IOPAMIDOL (ISOVUE-300) INJECTION 61%
75.0000 mL | Freq: Once | INTRAVENOUS | Status: AC | PRN
  Administered 2017-06-14: 75 mL via INTRAVENOUS

## 2017-06-21 ENCOUNTER — Telehealth: Payer: Self-pay | Admitting: Family Medicine

## 2017-06-21 NOTE — Telephone Encounter (Signed)
Patients wife calling about lab results  Please call her at [phone]

## 2017-06-22 ENCOUNTER — Other Ambulatory Visit: Payer: Self-pay | Admitting: Family Medicine

## 2017-06-22 DIAGNOSIS — R918 Other nonspecific abnormal finding of lung field: Secondary | ICD-10-CM

## 2017-06-22 DIAGNOSIS — R911 Solitary pulmonary nodule: Secondary | ICD-10-CM

## 2017-06-22 NOTE — Telephone Encounter (Signed)
Wife aware of results.

## 2017-10-05 ENCOUNTER — Other Ambulatory Visit: Payer: Self-pay | Admitting: Emergency Medicine

## 2017-10-05 ENCOUNTER — Ambulatory Visit (INDEPENDENT_AMBULATORY_CARE_PROVIDER_SITE_OTHER): Payer: Self-pay

## 2017-10-05 DIAGNOSIS — Z0000 Encounter for general adult medical examination without abnormal findings: Secondary | ICD-10-CM

## 2018-04-25 ENCOUNTER — Encounter: Payer: Self-pay | Admitting: Family Medicine

## 2018-06-05 ENCOUNTER — Other Ambulatory Visit: Payer: BLUE CROSS/BLUE SHIELD

## 2018-06-08 ENCOUNTER — Encounter: Payer: BLUE CROSS/BLUE SHIELD | Admitting: Family Medicine

## 2018-06-22 ENCOUNTER — Other Ambulatory Visit: Payer: BLUE CROSS/BLUE SHIELD

## 2018-10-13 ENCOUNTER — Encounter: Payer: Self-pay | Admitting: Family Medicine

## 2018-10-13 ENCOUNTER — Telehealth: Payer: BLUE CROSS/BLUE SHIELD | Admitting: Family Medicine

## 2018-10-13 DIAGNOSIS — J029 Acute pharyngitis, unspecified: Secondary | ICD-10-CM

## 2018-10-13 DIAGNOSIS — B9789 Other viral agents as the cause of diseases classified elsewhere: Secondary | ICD-10-CM

## 2018-10-13 DIAGNOSIS — J028 Acute pharyngitis due to other specified organisms: Secondary | ICD-10-CM

## 2018-10-13 NOTE — Progress Notes (Signed)

## 2018-10-17 ENCOUNTER — Encounter: Payer: Self-pay | Admitting: Physician Assistant

## 2018-10-17 ENCOUNTER — Ambulatory Visit: Payer: Self-pay | Admitting: Physician Assistant

## 2018-10-17 VITALS — BP 125/75 | HR 78 | Temp 98.5°F | Wt 232.6 lb

## 2018-10-17 DIAGNOSIS — J209 Acute bronchitis, unspecified: Secondary | ICD-10-CM

## 2018-10-17 MED ORDER — NAPHAZOLINE-PHENIRAMINE 0.025-0.3 % OP SOLN: 1.0000 [drp] | Freq: Four times a day (QID) | OPHTHALMIC | 0 refills | Status: AC | PRN

## 2018-10-17 MED ORDER — AZITHROMYCIN 250 MG PO TABS: ORAL_TABLET | ORAL | 0 refills | Status: AC

## 2018-10-17 MED ORDER — PROMETHAZINE-DM 6.25-15 MG/5ML PO SYRP: 5.0000 mL | ORAL_SOLUTION | Freq: Four times a day (QID) | ORAL | 0 refills | Status: AC | PRN

## 2018-10-17 NOTE — Progress Notes (Signed)
MRN: [id number] DOB: January 24, 1956  Subjective:   Bobby Bell is a 63 y.o. male presenting for chief complaint of 10 day hx of illness. Started out with scratchy throat, dry cough, and nasal congestion. Cough has worsened, now productive (greenish yellow phlegm, no hemoptysis). Has tried dayquil and nyquil with no full relief. Started having itchy red eyes yesterday. Denies fever, chills, sinus pain, ear pain, inability to swallow, voice change, wheezing, shortness of breath, chest pain and myalgia, nausea, vomiting, abdominal pain and diarrhea.He wears contacts but has not worn them since his eyes became itchy. Denies eye pain, photophobia, eye discharge, visual disturbance, and foreign body sensation. Sick exposure to wife.  No history of seasonal allergies, asthma, COPD, DM, or HTN. Does have PMH of pulmonary nodule, was supposed to have f/u CT in 05/2018 but canceled appointment. Has family doctor. Former smoker, quit in 2012.  Denies any other aggravating or relieving factors, no other questions or concerns.  Review of Systems  Constitutional: Negative for chills, diaphoresis and weight loss.  Neurological: Negative for dizziness and headaches.    [place] has a current medication list which includes the following prescription(s): ibuprofen, multivitamin, pseudoeph-doxylamine-dm-apap, and pseudoephedrine-apap-dm. Also has No Known Allergies.  [place]  has a past medical history of Appendiceal abscess, Complication of anesthesia, and Hyperlipidemia. Also  has a past surgical history that includes Finger surgery and Hernia repair.   Objective:   Vitals: BP 125/75 (BP Location: Right Arm, Patient Position: Sitting, Cuff Size: Normal)   Pulse 78   Temp 98.5 F (36.9 C) (Oral)   Wt 232 lb 9.6 oz (105.5 kg)   SpO2 98%   BMI 33.37 kg/m   Physical Exam Vitals signs reviewed.  Constitutional:      General: He is not in acute distress.    Appearance: He is well-developed. He is not toxic-appearing.   HENT:     Head: Normocephalic and atraumatic.     Right Ear: Tympanic membrane, ear canal and external ear normal.     Left Ear: Tympanic membrane, ear canal and external ear normal.     Nose:     Right Sinus: No maxillary sinus tenderness or frontal sinus tenderness.     Left Sinus: No maxillary sinus tenderness or frontal sinus tenderness.     Mouth/Throat:     Lips: Pink.     Mouth: Mucous membranes are moist.     Pharynx: Uvula midline. Posterior oropharyngeal erythema (mild) present.     Tonsils: No tonsillar exudate or tonsillar abscesses. Swelling: 1+ on the right. 1+ on the left.  Eyes:     General: Lids are normal.        Right eye: No discharge.        Left eye: No discharge.     Extraocular Movements: Extraocular movements intact.     Conjunctiva/sclera:     Right eye: Right conjunctiva is injected (mild).     Left eye: Left conjunctiva is injected (mild).     Pupils: Pupils are equal, round, and reactive to light.  Neck:     Musculoskeletal: Normal range of motion.  Cardiovascular:     Rate and Rhythm: Normal rate and regular rhythm.     Heart sounds: Normal heart sounds.  Pulmonary:     Effort: Pulmonary effort is normal.     Breath sounds: Normal breath sounds. No decreased breath sounds, wheezing, rhonchi or rales.  Lymphadenopathy:     Head:     Right side of head: No  submental, submandibular, tonsillar, preauricular, posterior auricular or occipital adenopathy.     Left side of head: No submental, submandibular, tonsillar, preauricular, posterior auricular or occipital adenopathy.     Cervical: No cervical adenopathy.     Upper Body:     Right upper body: No supraclavicular adenopathy.     Left upper body: No supraclavicular adenopathy.  Skin:    General: Skin is warm and dry.  Neurological:     Mental Status: He is alert.     No results found for this or any previous visit (from the past 24 hour(s)).  Assessment and Plan :  1. Acute bronchitis,  unspecified organism Patient is overall well-appearing, no acute distress.  VSS.  Lungs CTAB. History concerning for viral URI with secondary sickening.   Due to history and worsening cough, will cover for lower respiratory bacterial etiology at this time.  Also provide patient with symptomatic treatment.  Advised patient to follow-up with family physician or urgent care in 7 days if symptoms not improving with current treatment plan.  Seek care sooner if symptoms worsen or he develops new concerning symptoms.  Also strongly encourage patient to follow-up with family doctor regarding follow-up CT for pulmonary nodule.  Patient agrees to do so. - azithromycin (ZITHROMAX) 250 MG tablet; Take 2 tabs PO x 1 dose, then 1 tab PO QD x 4 days  Dispense: 6 tablet; Refill: 0 - naphazoline-pheniramine (NAPHCON-A) 0.025-0.3 % ophthalmic solution; Place 1 drop into both eyes 4 (four) times daily as needed for eye irritation.  Dispense: 15 mL; Refill: 0 - promethazine-dextromethorphan (PROMETHAZINE-DM) 6.25-15 MG/5ML syrup; Take 5 mLs by mouth 4 (four) times daily as needed for cough.  Dispense: 118 mL; Refill: 0   Tenna Delaine, [facility] 10/17/2018 4:51 PM

## 2018-10-17 NOTE — Patient Instructions (Signed)
Acute Bronchitis, Adult  Thank you for choosing [facility] for your health care needs.   You have been diagnosed with bronchitis (chest cold).   Recommend increase fluids; water, Gatorade, or hot tea with water or lemon. May use humidifier or vaporizer in bedroom. Use several pillows to prop self up at night, may help with coughing and sleeping. Rest.  Take antibiotic as prescribed.   Use eye drops for itching. Do not use contacts until eyes completely healed.    You have been prescribed a prescription cough syrup, Phenergan-DM. Use at night to help with cough and sleeping.   Hope you feel better soon.   Follow-up with family physician  or urgent care in 7days if symptoms not improving. Sooner with any worsening symptoms.  Acute bronchitis is when air tubes (bronchi) in the lungs suddenly get swollen. The condition can make it hard to breathe. It can also cause these symptoms:  A cough.  Coughing up clear, yellow, or green mucus.  Wheezing.  Chest congestion.  Shortness of breath.  A fever.  Body aches.  Chills.  A sore throat. Follow these instructions at home:  Medicines  Take over-the-counter and prescription medicines only as told by your doctor.  If you were prescribed an antibiotic medicine, take it as told by your doctor. Do not stop taking the antibiotic even if you start to feel better. General instructions  Rest.  Drink enough fluids to keep your pee (urine) pale yellow.  Avoid smoking and secondhand smoke. If you smoke and you need help quitting, ask your doctor. Quitting will help your lungs heal faster.  Use an inhaler, cool mist vaporizer, or humidifier as told by your doctor.  Keep all follow-up visits as told by your doctor. This is important. How is this prevented? To lower your risk of getting this condition again:  Wash your hands often with soap and water. If you cannot use soap and water, use hand sanitizer.  Avoid contact with people  who have cold symptoms.  Try not to touch your hands to your mouth, nose, or eyes.  Make sure to get the flu shot every year. Contact a doctor if:  Your symptoms do not get better in 2 weeks. Get help right away if:  You cough up blood.  You have chest pain.  You have very bad shortness of breath.  You become dehydrated.  You faint (pass out) or keep feeling like you are going to pass out.  You keep throwing up (vomiting).  You have a very bad headache.  Your fever or chills gets worse. This information is not intended to replace advice given to you by your health care provider. Make sure you discuss any questions you have with your health care provider. Document Released: 03/01/2008 Document Revised: 04/27/2017 Document Reviewed: 03/03/2016 Elsevier Interactive Patient Education  2019 [employer].

## 2018-10-18 ENCOUNTER — Ambulatory Visit: Payer: BLUE CROSS/BLUE SHIELD | Admitting: Family Medicine

## 2019-02-24 IMAGING — DX DG CHEST 1V
1 series · 1 of 1 positions shown · non-contrast
Comparison: 12/24/2016, CT 06/14/2012

CLINICAL DATA: Annual physical

EXAM:
CHEST 1 VIEW

[chest pa]
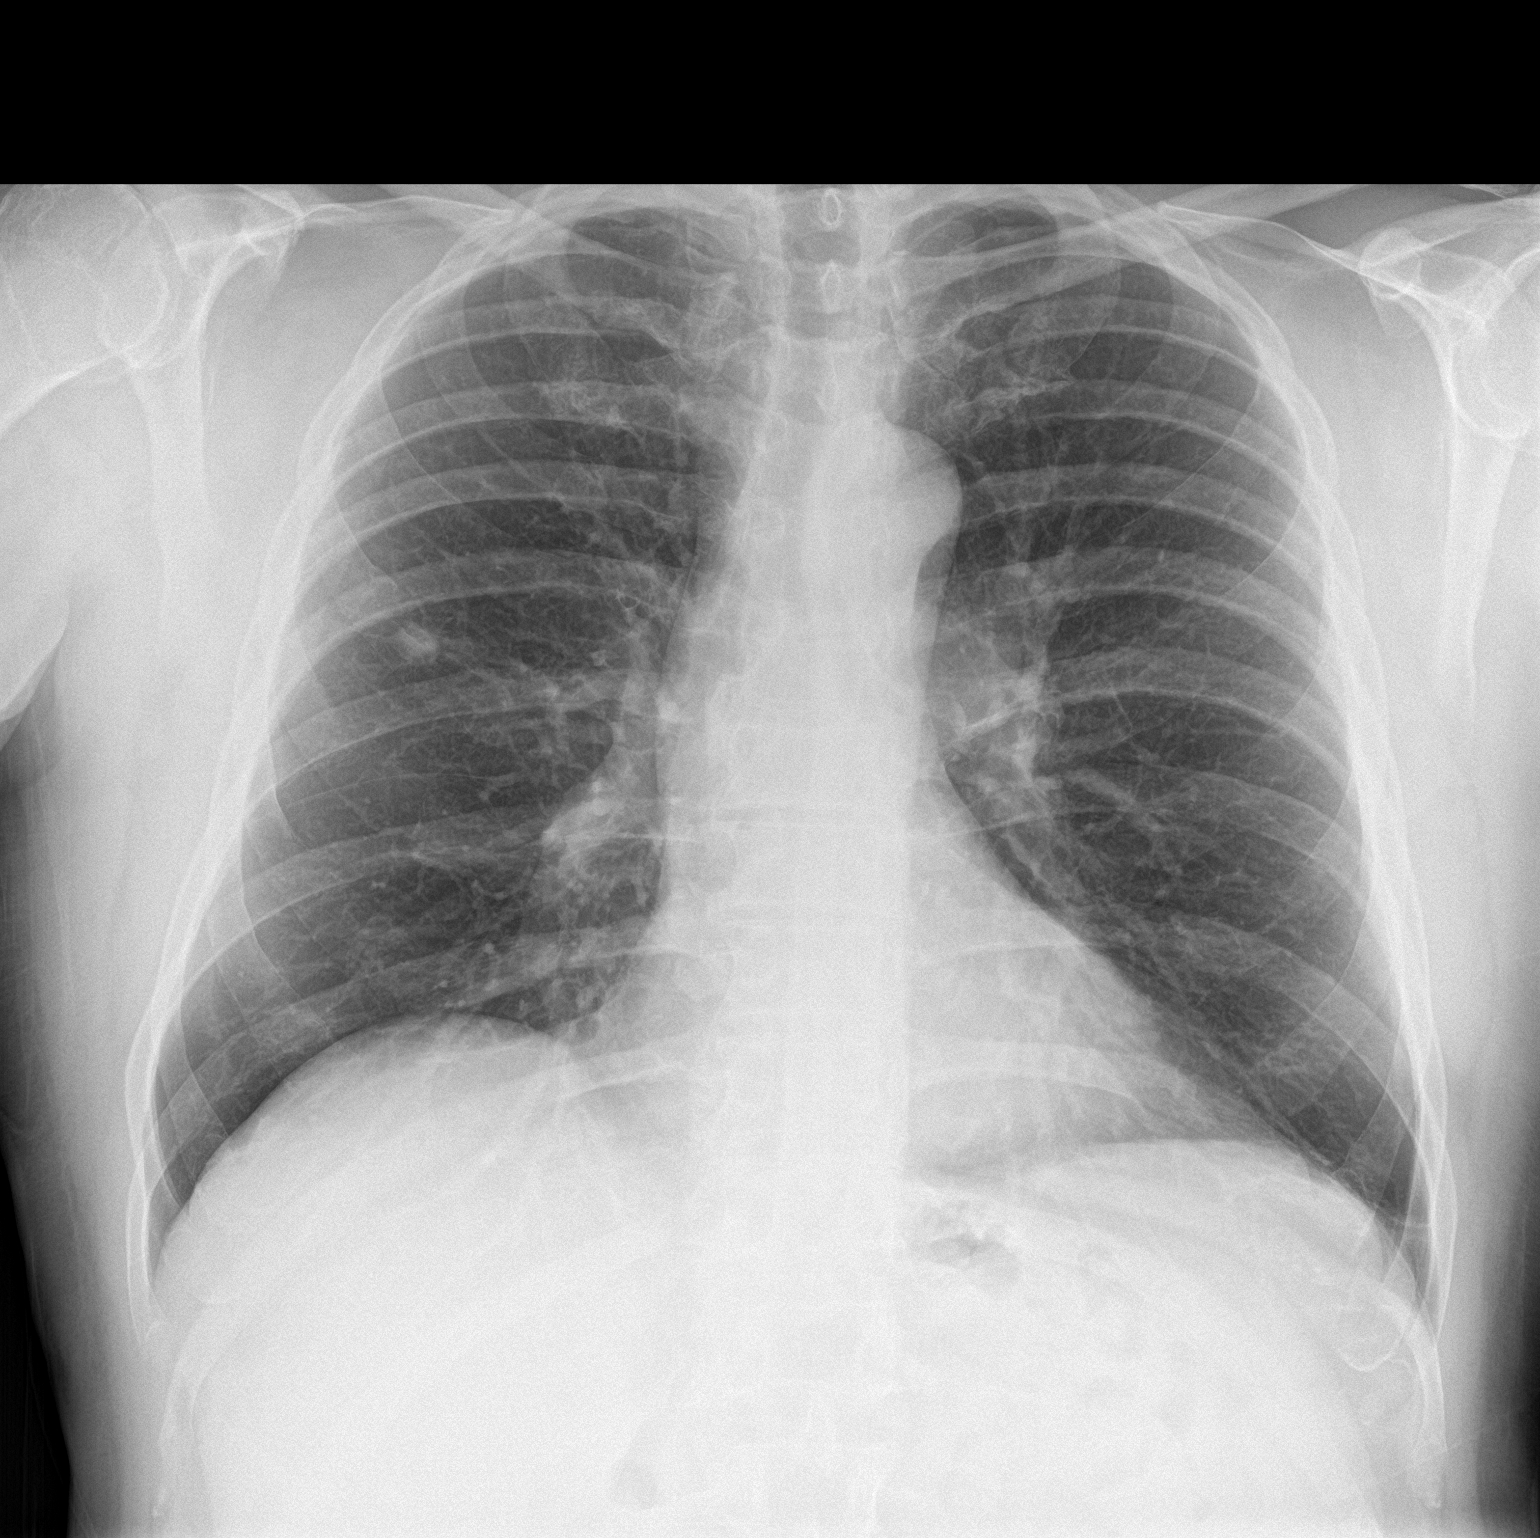

[1 of 1 positions shown; findings below may reference images not displayed]

FINDINGS: Heart size normal. Vascularity normal. Lungs clear without
infiltrate effusion or mass. 1 cm calcified nodule in the right
upper lobe is unchanged.
IMPRESSION: No active disease.
